# Patient Record
Sex: Female | Born: 1985 | Race: White | Hispanic: No | Smoking: Current every day smoker
Health system: Southern US, Community
[De-identification: ages and names within clinical notes are randomized; demographics above are authoritative.]

## PROBLEM LIST (undated history)

## (undated) DIAGNOSIS — F32A Depression, unspecified: Secondary | ICD-10-CM

## (undated) DIAGNOSIS — F329 Major depressive disorder, single episode, unspecified: Secondary | ICD-10-CM

## (undated) DIAGNOSIS — G43909 Migraine, unspecified, not intractable, without status migrainosus: Secondary | ICD-10-CM

## (undated) DIAGNOSIS — D649 Anemia, unspecified: Secondary | ICD-10-CM

## (undated) DIAGNOSIS — F419 Anxiety disorder, unspecified: Secondary | ICD-10-CM

## (undated) DIAGNOSIS — G930 Cerebral cysts: Secondary | ICD-10-CM

## (undated) DIAGNOSIS — R569 Unspecified convulsions: Secondary | ICD-10-CM

## (undated) DIAGNOSIS — J45909 Unspecified asthma, uncomplicated: Secondary | ICD-10-CM

## (undated) DIAGNOSIS — R87629 Unspecified abnormal cytological findings in specimens from vagina: Secondary | ICD-10-CM

## (undated) HISTORY — DX: Anxiety disorder, unspecified: F41.9

## (undated) HISTORY — DX: Depression, unspecified: F32.A

## (undated) HISTORY — PX: DENTAL SURGERY: SHX609

## (undated) HISTORY — DX: Migraine, unspecified, not intractable, without status migrainosus: G43.909

## (undated) HISTORY — DX: Major depressive disorder, single episode, unspecified: F32.9

## (undated) HISTORY — DX: Unspecified asthma, uncomplicated: J45.909

---
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
## 2007-09-20 HISTORY — PX: WISDOM TOOTH EXTRACTION: SHX21

## 2009-10-16 DIAGNOSIS — G930 Cerebral cysts: Secondary | ICD-10-CM

## 2009-10-16 HISTORY — DX: Cerebral cysts: G93.0

## 2010-06-14 ENCOUNTER — Emergency Department (HOSPITAL_COMMUNITY): Admission: EM | Admit: 2010-06-14 | Discharge: 2010-06-15 | Payer: Self-pay | Admitting: Emergency Medicine

## 2010-12-02 LAB — URINALYSIS, ROUTINE W REFLEX MICROSCOPIC
Glucose, UA: NEGATIVE mg/dL
Ketones, ur: NEGATIVE mg/dL
Leukocytes, UA: NEGATIVE
Protein, ur: NEGATIVE mg/dL

## 2010-12-02 LAB — ETHANOL: Alcohol, Ethyl (B): 101 mg/dL — ABNORMAL HIGH (ref 0–10)

## 2010-12-02 LAB — BASIC METABOLIC PANEL
CO2: 24 mEq/L (ref 19–32)
Chloride: 108 mEq/L (ref 96–112)
Creatinine, Ser: 0.51 mg/dL (ref 0.4–1.2)
GFR calc Af Amer: >60 mL/min (ref >60)
GFR calc non Af Amer: >60 mL/min (ref >60)
Sodium: 141 mEq/L (ref 135–145)

## 2010-12-02 LAB — CBC
HCT: 34.6 % — ABNORMAL LOW (ref 36.0–46.0)
Hemoglobin: 12 g/dL (ref 12.0–15.0)
RBC: 3.78 MIL/uL — ABNORMAL LOW (ref 3.87–5.11)
RDW: 12.9 % (ref 11.5–15.5)
WBC: 4.9 10*3/uL (ref 4.0–10.5)

## 2010-12-02 LAB — RAPID URINE DRUG SCREEN, HOSP PERFORMED
Amphetamines: NOT DETECTED
Benzodiazepines: POSITIVE — AB
Cocaine: NOT DETECTED

## 2010-12-02 LAB — DIFFERENTIAL
Basophils Absolute: 0 10*3/uL (ref 0.0–0.1)
Lymphocytes Relative: 38 % (ref 12–46)
Lymphs Abs: 1.9 10*3/uL (ref 0.7–4.0)
Monocytes Absolute: 0.3 10*3/uL (ref 0.1–1.0)
Monocytes Relative: 6 % (ref 3–12)
Neutro Abs: 2.6 10*3/uL (ref 1.7–7.7)

## 2010-12-02 LAB — POCT PREGNANCY, URINE: Preg Test, Ur: NEGATIVE

## 2014-07-28 ENCOUNTER — Ambulatory Visit (INDEPENDENT_AMBULATORY_CARE_PROVIDER_SITE_OTHER): Payer: Medicaid Other | Admitting: Neurology

## 2014-07-28 ENCOUNTER — Encounter: Payer: Self-pay | Admitting: Neurology

## 2014-07-28 VITALS — BP 115/78 | HR 81 | Ht 64.0 in | Wt 121.0 lb

## 2014-07-28 DIAGNOSIS — Z8669 Personal history of other diseases of the nervous system and sense organs: Secondary | ICD-10-CM

## 2014-07-28 DIAGNOSIS — G43109 Migraine with aura, not intractable, without status migrainosus: Secondary | ICD-10-CM

## 2014-07-28 DIAGNOSIS — G930 Cerebral cysts: Secondary | ICD-10-CM

## 2014-07-28 MED ORDER — TOPIRAMATE 50 MG PO TABS: 50.0000 mg | ORAL_TABLET | Freq: Two times a day (BID) | ORAL | Status: DC

## 2014-07-28 MED ORDER — ONDANSETRON HCL 4 MG PO TABS: 4.0000 mg | ORAL_TABLET | Freq: Two times a day (BID) | ORAL | Status: DC | PRN

## 2014-07-28 NOTE — Progress Notes (Signed)
Subjective:    Patient ID: Hayley Peterson is a 28 y.o. female.  HPI     Huston Foley, MD, PhD [facility] [address], [address] [address] [place], [place] [address]  Dear Dr. Loney Hering,   I saw your patient, Hayley Peterson, upon your kind request in my neurologic clinic today for initial consultation of her migraine headaches and what you refer to as seizure like activity. The patient is accompanied by her mother today. As you know, Hayley Peterson is a 28 year old right-handed woman with an underlying medical history of asthma, panic attacks, smoking, folliculitis, fibromyalgia, insomnia, who reports a 10 year history of migraine headaches off and on, worse during pregnancy. She has a 39 year old daughter and a 29-year-old son. She describes a one-sided throbbing headache. She has nearly daily migraines, she describes an occasional visual aura with blurry vision and sometimes visual field cut. She has not had her eyes checked in some time. She was recently started on Topamax 25 mg twice daily about a week ago and feels it has not helped so far. She says that because she did not have insurance she did not try anything else in the past other than over-the-counter medication including Advil, Aleve, Tylenol, BC powder, Goody's. None of these were very helpful. She also reports a history of seizures. She was treated in the past with Lamictal and also with gabapentin but has not been on medication for months as I understand. She says her last seizure was about 3 months ago and is not able to describe her seizures. Her mother adds that she had a grand mal seizure before and was taken to [facility] for this. Her last seizure was witnessed by her husband. She is currently separated. EMS was not called and she did not go to the hospital. She is in the process of scheduling oral surgery for extensive dental work.  According to your clinic note from 07/22/2014 she had workup for seizures  extensively at [facility] in 2011 and was not diagnosed with seizures. She did not have insurance at the time.   She had seen a neurologist in [place], Dr. Ninetta Lights, in 2011 for seizures and had an MRI brain and EEG. She reports having had an abnormal EEG. She was on Sz medications. But is currently not on medications specifically for seizures. Unfortunately I do not have any records from [facility] or Dr. Ninetta Lights available for review. For migraine prevention you recently started her on Topamax, 25 mg twice daily. She reports associated photophobia and nausea. She tried Phenergan in the past but it was too sedating. She says her MRI showed an arachnoid cyst. I do not have the MRI report available for review. She had it done at [facility].  Her Past Medical History Is Significant For: Past Medical History  Diagnosis Date  . Asthma   . Anxiety   . Depression   . Migraines     Her Past Surgical History Is Significant For: Past Surgical History  Procedure Laterality Date  . Wisdom tooth extraction  2009    Her Family History Is Significant For: Family History  Problem Relation Age of Onset  . Heart disease Maternal Grandfather   . Kidney disease Maternal Grandfather   . Clotting disorder Maternal Grandmother   . Stroke    . Stroke Father   . Heart disease Paternal Uncle   . Heart attack Paternal Grandfather   . Heart attack Father  Her Social History Is Significant For: History   Social History  . Marital Status: Legally Separated    Spouse Name: N/A    Number of Children: 2  . Years of Education: 11   Occupational History  . 2     unemployed   Social History Main Topics  . Smoking status: Current Every Day Smoker  . Smokeless tobacco: Never Used     Comment: 1/2 pack daily  . Alcohol Use: No  . Drug Use: No  . Sexual Activity: None   Other Topics Concern  . None   Social History Narrative   Patient consumes 8-10 cups of  caffeine daily    Her Allergies Are:  Allergies  Allergen Reactions  . Latex Rash  :   Her Current Medications Are:  Outpatient Encounter Prescriptions as of 07/28/2014  Medication Sig  . albuterol (PROVENTIL HFA;VENTOLIN HFA) 108 (90 BASE) MCG/ACT inhaler Inhale into the lungs every 6 (six) hours as needed for wheezing or shortness of breath. 2 puffs as needed  . FLUoxetine (PROZAC) 40 MG capsule Take 40 mg by mouth daily.  Marland Kitchen. sulfamethoxazole-trimethoprim (SEPTRA DS) 800-160 MG per tablet Take 1 tablet by mouth 2 (two) times daily.  Marland Kitchen. topiramate (TOPAMAX) 50 MG tablet Take 1 tablet (50 mg total) by mouth 2 (two) times daily.  . [DISCONTINUED] topiramate (TOPAMAX) 25 MG tablet Take 25 mg by mouth 2 (two) times daily.  . ondansetron (ZOFRAN) 4 MG tablet Take 1 tablet (4 mg total) by mouth 2 (two) times daily as needed for nausea or vomiting.  :  Review of Systems:  Out of a complete 14 point review of systems, all are reviewed and negative with the exception of these symptoms as listed below:    Review of Systems  Constitutional: Positive for fever and chills.       Weight loss  HENT:       Spinning sensation  Eyes:       Blurred vision  Respiratory: Positive for cough, shortness of breath and wheezing.        Snoring  Cardiovascular: Positive for chest pain.  Endocrine: Positive for cold intolerance.  Musculoskeletal:       Joint pain, cramps, aching muscles  Allergic/Immunologic:       Skin sensitivity  Neurological: Positive for tremors, seizures, weakness and headaches.       Memory loss, restless legs  Hematological: Bruises/bleeds easily.  Psychiatric/Behavioral: Positive for confusion.       Depression, anxiety, decreased energy,  Racing thoughts    Objective:  Neurologic Exam  Physical Exam Physical Examination:   Filed Vitals:   07/28/14 0957  BP: 115/78  Pulse: 81   General Examination: The patient is a very pleasant 28 y.o. female in no acute distress.  She appears well-developed and well-nourished and adequately groomed. She is quite slender.  HEENT: Normocephalic, atraumatic, pupils are equal, round and reactive to light and accommodation. Funduscopic exam is normal with sharp disc margins noted. Extraocular tracking is good without limitation to gaze excursion or nystagmus noted. Normal smooth pursuit is noted. Hearing is grossly intact. Tympanic membranes are clear bilaterally. Face is symmetric with normal facial animation and normal facial sensation. Speech is clear with no dysarthria noted. There is no hypophonia. There is no lip, neck/head, jaw or voice tremor. Neck is supple with full range of passive and active motion. There are no carotid bruits on auscultation. Oropharynx exam reveals: mild mouth dryness, poor dental hygiene and no  significant airway crowding. Tonsils are small. She gingival hypertrophy and appears to have periodontal disease as well. Mallampati is class I. Tongue protrudes centrally and palate elevates symmetrically.    Chest: Clear to auscultation without wheezing, rhonchi or crackles noted.  Heart: S1+S2+0, regular and normal without murmurs, rubs or gallops noted.   Abdomen: Soft, non-tender and non-distended with normal bowel sounds appreciated on auscultation.  Extremities: There is no pitting edema in the distal lower extremities bilaterally. Pedal pulses are intact.  Skin: Warm and dry without trophic changes noted. There are no varicose veins.  Musculoskeletal: exam reveals no obvious joint deformities, tenderness or joint swelling or erythema.   Neurologically:  Mental status: The patient is awake, alert and oriented in all 4 spheres. Her immediate and remote memory, attention, language skills and fund of knowledge are appropriate. There is no evidence of aphasia, agnosia, apraxia or anomia. Speech is clear with normal prosody and enunciation. Thought process is linear. Mood is normal and affect is normal.   Cranial nerves II - XII are as described above under HEENT exam. In addition: shoulder shrug is normal with equal shoulder height noted. Motor exam: Normal bulk, strength and tone is noted. There is no drift, tremor or rebound. Romberg is negative. Reflexes are 2+ throughout. Babinski: Toes are flexor bilaterally. Fine motor skills and coordination: intact with normal finger taps, normal hand movements, normal rapid alternating patting, normal foot taps and normal foot agility.  Cerebellar testing: No dysmetria or intention tremor on finger to nose testing. Heel to shin is unremarkable bilaterally. There is no truncal or gait ataxia.  Sensory exam: intact to light touch, pinprick, vibration, temperature sense in the upper and lower extremities.  Gait, station and balance: She stands easily. No veering to one side is noted. No leaning to one side is noted. Posture is age-appropriate and stance is narrow based. Gait shows normal stride length and normal pace. No problems turning are noted. She turns en bloc. Tandem walk is unremarkable. Intact toe and heel stance is noted.               Assessment and Plan:   In summary, Hayley Peterson is a very pleasant 28 y.o.-year old female with an underlying medical history of asthma, panic attacks, smoking, folliculitis, fibromyalgia, insomnia, who reports a 10 year history of migraine headaches off and on, with nearly daily headaches at this time. Her history is suggestive of migraine with aura. Her physical exam is normal. She is reassured in that regard. She reports a seizure history. She reports having had arachnoid cyst diagnosed before on brain MRI. I explained to her that an arachnoid cyst is typically congenital. We can certainly repeat her brain MRI for comparison. We need records from [facility] as well as from her prior neurologist. She is advised to sign release of healthcare information and we will try to get her  records. If you have any records from [facility] or from Dr. Ninetta Lights, I would greatly appreciate if you can fax them to me as well. At this juncture, I suggested she increase Topamax to 50 g twice daily. For nausea I suggested Zofran as needed. We will call her with her MRI results.  I had a long chat with the patient and her mother about my findings and the diagnosis of migraines, the prognosis and treatment options. We talked about medical treatments and non-pharmacological approaches. We talked about maintaining a healthy lifestyle in general. I  encouraged the patient to eat healthy, exercise daily and keep well hydrated, to keep a scheduled bedtime and wake time routine, to not skip any meals and eat healthy snacks in between meals and to have protein with every meal.   I advised the patient about common headache triggers: sleep deprivation, dehydration, overheating, stress, hypoglycemia or skipping meals and blood sugar fluctuations, excessive pain medications or excessive alcohol use or caffeine withdrawal. Some people have food triggers such as aged cheese, orange juice or chocolate, especially dark chocolate, or MSG (monosodium glutamate). She is to try to avoid these headache triggers as much possible. It may be helpful to keep a headache diary to figure out what makes Her headaches worse or brings them on and what alleviates them. Some people report headache onset after exercise but studies have shown that regular exercise may actually prevent headaches from coming. If She has exercise-induced headaches, She is advised to drink plenty of fluid before and after exercising and that to not overdo it and to not overheat.  They are advised to call 911 if she were to have a seizure.  Again, I advised him that I need my records regarding her presumed seizure diagnosis. I did not suggest any other new medications today. I answered all her questions today and the patient and her mother were in agreement. Of  note, the patient currently lives with her mother and her 2 children. I will see her in 3 months, sooner if the need arises.  Thank you very much for allowing me to participate in the care of this nice patient. If I can be of any further assistance to you please do not hesitate to call me at [phone].  Sincerely,   Huston FoleySaima Madsen Riddle, MD, PhD

## 2014-07-28 NOTE — Patient Instructions (Addendum)
We will try to get records from Dr. Ninetta Lightsesfaye and [facility] regarding your seizure history.   Please remember, common headache triggers are: sleep deprivation, dehydration, overheating, stress, hypoglycemia or skipping meals and blood sugar fluctuations, excessive pain medications or excessive alcohol use or caffeine withdrawal. Some people have food triggers such as aged cheese, orange juice or chocolate, especially dark chocolate, or MSG (monosodium glutamate). Try to avoid these headache triggers as much possible. It may be helpful to keep a headache diary to figure out what makes your headaches worse or brings them on and what alleviates them. Some people report headache onset after exercise but studies have shown that regular exercise may actually prevent headaches from coming. If you have exercise-induced headaches, please make sure that you drink plenty of fluid before and after exercising and that you do not over do it and do not overheat.  We will do another brain MRI for comparison.   We will increase your Topamax to 50 mg twice daily and use Zofran as needed for nausea.

## 2014-08-19 ENCOUNTER — Ambulatory Visit
Admission: RE | Admit: 2014-08-19 | Discharge: 2014-08-19 | Disposition: A | Discharge status: Home / Self Care | Payer: Medicaid Other | Source: Ambulatory Visit | Attending: Neurology | Admitting: Neurology

## 2014-08-19 ENCOUNTER — Encounter (INDEPENDENT_AMBULATORY_CARE_PROVIDER_SITE_OTHER): Payer: Medicaid Other | Admitting: Diagnostic Neuroimaging

## 2014-08-19 DIAGNOSIS — G930 Cerebral cysts: Secondary | ICD-10-CM

## 2014-08-19 DIAGNOSIS — Z8669 Personal history of other diseases of the nervous system and sense organs: Secondary | ICD-10-CM

## 2014-08-19 DIAGNOSIS — G43109 Migraine with aura, not intractable, without status migrainosus: Secondary | ICD-10-CM

## 2014-08-21 NOTE — Progress Notes (Signed)
Quick Note:  Please call patient: Recent brain MRI without contrast showed stable findings and report suggests unchanged findings in fact from a previous MRI from 10/05/2010. This is reassuring. No further action is required. Hayley FoleySaima Marston Mccadden, MD, PhD [facility]  ______

## 2014-10-28 ENCOUNTER — Ambulatory Visit: Payer: Medicaid Other | Admitting: Neurology

## 2014-12-03 ENCOUNTER — Ambulatory Visit (INDEPENDENT_AMBULATORY_CARE_PROVIDER_SITE_OTHER): Payer: Medicaid Other | Admitting: Neurology

## 2014-12-03 ENCOUNTER — Encounter: Payer: Self-pay | Admitting: Neurology

## 2014-12-03 VITALS — BP 98/50 | HR 74 | Resp 12 | Ht 64.0 in | Wt 115.0 lb

## 2014-12-03 DIAGNOSIS — F419 Anxiety disorder, unspecified: Secondary | ICD-10-CM

## 2014-12-03 DIAGNOSIS — Z720 Tobacco use: Secondary | ICD-10-CM

## 2014-12-03 DIAGNOSIS — F17200 Nicotine dependence, unspecified, uncomplicated: Secondary | ICD-10-CM

## 2014-12-03 DIAGNOSIS — G930 Cerebral cysts: Secondary | ICD-10-CM

## 2014-12-03 DIAGNOSIS — G43109 Migraine with aura, not intractable, without status migrainosus: Secondary | ICD-10-CM | POA: Diagnosis not present

## 2014-12-03 MED ORDER — TOPIRAMATE 50 MG PO TABS: ORAL_TABLET | ORAL | Status: DC

## 2014-12-03 NOTE — Patient Instructions (Addendum)
Please remember, common headache triggers are: sleep deprivation, dehydration, overheating, stress, hypoglycemia or skipping meals and blood sugar fluctuations, excessive pain medications or excessive alcohol use or caffeine withdrawal. Some people have food triggers such as aged cheese, orange juice or chocolate, especially dark chocolate, or MSG (monosodium glutamate). Try to avoid these headache triggers as much possible. It may be helpful to keep a headache diary to figure out what makes your headaches worse or brings them on and what alleviates them. Some people report headache onset after exercise but studies have shown that regular exercise may actually prevent headaches from coming. If you have exercise-induced headaches, please make sure that you drink plenty of fluid before and after exercising and that you do not over do it and do not overheat.  We will increase your topamax to 50 mg in AM and 100 mg at night, this may help you sleep better too.  Topamax can lower the efficacy of hormonal birth control. Please use a secondary birth control, such as barrier protection.   Follow up in 4 to 5 months.   Drink more water and reduce your Cola intake. Please stop smoking.

## 2014-12-03 NOTE — Progress Notes (Signed)
Subjective:    Patient ID: Hayley Peterson is a 29 y.o. female.  HPI     Interim history:   Hayley Peterson is a 29 year old right-handed woman with an underlying medical history of asthma, panic attacks, smoking, folliculitis, history of brain arachnoid cyst, fibromyalgia, history of seizures in the past, and insomnia, who presents for follow-up consultation of her migraine headaches. The patient is accompanied by her husband, Jeneen Rinks, today. I first met her on 07/28/2014 at the request of her primary care physician, at which time she reported a 10 year history of migraine headaches off and on, worse during pregnancy. I suggested she continue with Topamax for headache prevention and prescribed Zofran as needed for nausea. I suggested we request previous neurology records from her prior neurologist. We talked about headache prevention and seizure precautions last time. I suggested a brain MRI without contrast. She had this on 08/19/2014: Unremarkable MRI brain (without) demonstrating stable small right posterior fossa arachnoid cyst. Remainder of brain parenchyma normal. No change from MRI on 10/05/10. In addition, personally reviewed the images through the PACS system. I did not see any acute changes. We called her with her test results.   Today, 12/03/2014: She is providing most of her own history but her husband provides some additional information. She reports that she still is getting migraines about 2 or 3 times per week. She's not sure of the increase in Topamax helped at all. She has to use Zofran only very occasionally has nausea is not a big player. She has no side effects from the increase in Topamax. She has been on Prozac through her GYN which has helped her depression but she still has anxiety issues. She has not yet seen her primary care physician for this. She admits that she does not drink enough water. She drinks more than 3 or 4 cans of cola per day. She still smokes, half a pack per day. She  endorsed stress.  Previously:  She has a 36 year old daughter and a 84-year-old son. She describes a one-sided throbbing headache. She has nearly daily migraines, she describes an occasional visual aura with blurry vision and sometimes visual field cut. She has not had her eyes checked in some time. She was recently started on Topamax 25 mg twice daily about a week ago and feels it has not helped so far. She says that because she did not have insurance she did not try anything else in the past other than over-the-counter medication including Advil, Aleve, Tylenol, BC powder, Goody's. None of these were very helpful. She also reports a history of seizures. She was treated in the past with Lamictal and also with gabapentin but has not been on medication for months as I understand. She says her last seizure was about 3 months ago and is not able to describe her seizures. Her mother adds that she had a grand mal seizure before and was taken to [facility] for this. Her last seizure was witnessed by her husband. She is currently separated. EMS was not called and she did not go to the hospital. She is in the process of scheduling oral surgery for extensive dental work.   According to your clinic note from 07/22/2014 she had workup for seizures extensively at [facility] in 2011 and was not diagnosed with seizures. She did not have insurance at the time.    She had seen a neurologist in [place], Dr. Brandon Melnick, in 2011 for seizures and had an  MRI brain and EEG. She reports having had an abnormal EEG. She was on Sz medications. But is currently not on medications specifically for seizures. Unfortunately I do not have any records from [facility] or Dr. Brandon Melnick available for review. For migraine prevention you recently started her on Topamax, 25 mg twice daily. She reports associated photophobia and nausea. She tried Phenergan in the past but it was too sedating. She says her MRI showed  an arachnoid cyst. I do not have the MRI report available for review. She had it done at [facility].  Her Past Medical History Is Significant For: Past Medical History  Diagnosis Date  . Asthma   . Anxiety   . Depression   . Migraines     Her Past Surgical History Is Significant For: Past Surgical History  Procedure Laterality Date  . Wisdom tooth extraction  2009    Her Family History Is Significant For: Family History  Problem Relation Age of Onset  . Heart disease Maternal Grandfather   . Kidney disease Maternal Grandfather   . Clotting disorder Maternal Grandmother   . Stroke    . Stroke Father   . Heart disease Paternal Uncle   . Heart attack Paternal Grandfather   . Heart attack Father     Her Social History Is Significant For: History   Social History  . Marital Status: Legally Separated    Spouse Name: N/A  . Number of Children: 2  . Years of Education: 11   Occupational History  . 2     unemployed   Social History Main Topics  . Smoking status: Current Every Day Smoker  . Smokeless tobacco: Never Used     Comment: 1/2 pack daily  . Alcohol Use: No  . Drug Use: No  . Sexual Activity: Not on file   Other Topics Concern  . None   Social History Narrative   Patient consumes 8-10 cups of caffeine daily    Her Allergies Are:  Allergies  Allergen Reactions  . Latex Rash  :   Her Current Medications Are:  Outpatient Encounter Prescriptions as of 12/03/2014  Medication Sig  . albuterol (PROVENTIL HFA;VENTOLIN HFA) 108 (90 BASE) MCG/ACT inhaler Inhale into the lungs every 6 (six) hours as needed for wheezing or shortness of breath. 2 puffs as needed  . FLUoxetine (PROZAC) 40 MG capsule Take 40 mg by mouth daily.  . ondansetron (ZOFRAN) 4 MG tablet Take 1 tablet (4 mg total) by mouth 2 (two) times daily as needed for nausea or vomiting.  . topiramate (TOPAMAX) 50 MG tablet Take 1 tablet (50 mg total) by mouth 2 (two) times daily.  .  [DISCONTINUED] sulfamethoxazole-trimethoprim (SEPTRA DS) 800-160 MG per tablet Take 1 tablet by mouth 2 (two) times daily.  :  Review of Systems:  Out of a complete 14 point review of systems, all are reviewed and negative with the exception of these symptoms as listed below:   Review of Systems  Neurological: Positive for headaches.       Feels that Topamax 71m BID is not helping her headaches.  Reports that she is still having panic attacks.     Objective:  Neurologic Exam  Physical Exam Physical Examination:   Filed Vitals:   12/03/14 1114  BP: 98/50  Pulse: 74  Resp: 12   General Examination: The patient is a very pleasant 29y.o. female in no acute distress. She appears well-developed and well-nourished and adequately groomed. She is quite  slender.  HEENT: Normocephalic, atraumatic, pupils are equal, round and reactive to light and accommodation. Funduscopic exam is normal with sharp disc margins noted. Extraocular tracking is good without limitation to gaze excursion or nystagmus noted. Normal smooth pursuit is noted. Hearing is grossly intact. Face is symmetric with normal facial animation and normal facial sensation. Speech is clear with no dysarthria noted. There is no hypophonia. There is no lip, neck/head, jaw or voice tremor. Neck is supple with full range of passive and active motion. There are no carotid bruits on auscultation. Oropharynx exam reveals: mild mouth dryness, poor dental hygiene and no significant airway crowding. Tonsils are small. She gingival hypertrophy and appears to have periodontal disease as well. She is missing both front teeth. Mallampati is class I. Tongue protrudes centrally and palate elevates symmetrically.    Chest: Clear to auscultation without wheezing, rhonchi or crackles noted.  Heart: S1+S2+0, regular and normal without murmurs, rubs or gallops noted.   Abdomen: Soft, non-tender and non-distended with normal bowel sounds appreciated on  auscultation.  Extremities: There is no pitting edema in the distal lower extremities bilaterally. Pedal pulses are intact.  Skin: Warm and dry without trophic changes noted. There are no varicose veins.  Musculoskeletal: exam reveals no obvious joint deformities, tenderness or joint swelling or erythema.   Neurologically:  Mental status: The patient is awake, alert and oriented in all 4 spheres. Her immediate and remote memory, attention, language skills and fund of knowledge are appropriate. There is no evidence of aphasia, agnosia, apraxia or anomia. Speech is clear with normal prosody and enunciation. Thought process is linear. Mood is normal and affect is normal.  Cranial nerves II - XII are as described above under HEENT exam. In addition: shoulder shrug is normal with equal shoulder height noted. Motor exam: Normal bulk, strength and tone is noted. There is no drift, tremor or rebound. Romberg is negative. Reflexes are 2+ throughout. Babinski: Toes are flexor bilaterally. Fine motor skills and coordination: intact with normal finger taps, normal hand movements, normal rapid alternating patting, normal foot taps and normal foot agility.  Cerebellar testing: No dysmetria or intention tremor on finger to nose testing. Heel to shin is unremarkable bilaterally. There is no truncal or gait ataxia.  Sensory exam: intact to light touch, pinprick, vibration, temperature sense in the upper and lower extremities.  Gait, station and balance: She stands easily. No veering to one side is noted. No leaning to one side is noted. Posture is age-appropriate and stance is narrow based. Gait shows normal stride length and normal pace. No problems turning are noted. She turns en bloc. Tandem walk is unremarkable.              Assessment and Plan:   In summary, TIFFINE HENIGAN is a very pleasant 29 year old female with an underlying medical history of asthma, panic attacks, smoking, depression, anxiety,   folliculitis, fibromyalgia,  and insomnia, who presents for follow-up consultation of her migraines. She still has fairly frequent migraines. We talked about her brain MRI results today. I reassured her that findings were stable. I personally reviewed the images as well. Unfortunately, I did not receive any records from her prior neurologist. Her physical exam is stable and she is reassured. She is encouraged to drink more water. She is advised to reduce her caffeine intake which can also cause rebound headaches. She is advised to stop smoking. We talked about headache triggers again today. For anxiety she is advised to talk  to her primary care physician and make an appointment with his office. She was in agreement. For her migraines I would like for her to increase Topamax to 50 mg in the morning and increase the nighttime dose to 100 mg each night. She has been able to tolerate it thus far. I adjusted her prescription and she can continue to take Zofran as needed for nausea.  We talked about medical treatments and non-pharmacological approaches. We talked about maintaining a healthy lifestyle in general. I encouraged the patient to eat healthy, exercise daily and keep well hydrated, to keep a scheduled bedtime and wake time routine, to not skip any meals and eat healthy snacks in between meals and to have protein with every meal.   I advised the patient about common headache triggers: sleep deprivation, dehydration, overheating, stress, hypoglycemia or skipping meals and blood sugar fluctuations, excessive pain medications or excessive alcohol use or caffeine withdrawal. Some people have food triggers such as aged cheese, orange juice or chocolate, especially dark chocolate, or MSG (monosodium glutamate). She is to try to avoid these headache triggers as much possible. It may be helpful to keep a headache diary to figure out what makes Her headaches worse or brings them on and what alleviates them. Some people  report headache onset after exercise but studies have shown that regular exercise may actually prevent headaches from coming. If She has exercise-induced headaches, She is advised to drink plenty of fluid before and after exercising and that to not overdo it and to not overheat. I would like to see her back in 4-5 months, sooner if the need arises. I answered all her questions today and she and her husband were in agreement.  I spent 20 minutes in total face-to-face time with the patient, more than 50% of which was spent in counseling and coordination of care, reviewing test results, reviewing medication and discussing or reviewing the diagnosis of migraines and anxiety, the prognosis and treatment options.

## 2015-04-13 ENCOUNTER — Telehealth: Payer: Self-pay

## 2015-04-13 NOTE — Telephone Encounter (Signed)
I left patient a message. She has appt in August but we have some openings this week. I stated that if she is interested to call back and I will get her in this week.

## 2015-05-05 ENCOUNTER — Ambulatory Visit: Payer: Medicaid Other | Admitting: Neurology

## 2015-06-30 ENCOUNTER — Ambulatory Visit (INDEPENDENT_AMBULATORY_CARE_PROVIDER_SITE_OTHER): Payer: Medicaid Other | Admitting: Neurology

## 2015-06-30 ENCOUNTER — Encounter: Payer: Self-pay | Admitting: Neurology

## 2015-06-30 VITALS — BP 102/64 | HR 80 | Resp 16 | Ht 64.0 in | Wt 119.0 lb

## 2015-06-30 DIAGNOSIS — F17200 Nicotine dependence, unspecified, uncomplicated: Secondary | ICD-10-CM

## 2015-06-30 DIAGNOSIS — G930 Cerebral cysts: Secondary | ICD-10-CM | POA: Diagnosis not present

## 2015-06-30 DIAGNOSIS — G43109 Migraine with aura, not intractable, without status migrainosus: Secondary | ICD-10-CM | POA: Diagnosis not present

## 2015-06-30 DIAGNOSIS — Z8669 Personal history of other diseases of the nervous system and sense organs: Secondary | ICD-10-CM | POA: Diagnosis not present

## 2015-06-30 DIAGNOSIS — Z720 Tobacco use: Secondary | ICD-10-CM

## 2015-06-30 MED ORDER — ONDANSETRON HCL 4 MG PO TABS: 4.0000 mg | ORAL_TABLET | Freq: Two times a day (BID) | ORAL | Status: DC | PRN

## 2015-06-30 MED ORDER — BUTALBITAL-APAP-CAFFEINE 50-325-40 MG PO TABS: 1.0000 | ORAL_TABLET | Freq: Four times a day (QID) | ORAL | Status: DC | PRN

## 2015-06-30 MED ORDER — TOPIRAMATE 50 MG PO TABS: ORAL_TABLET | ORAL | Status: DC

## 2015-06-30 MED ORDER — NORTRIPTYLINE HCL 10 MG PO CAPS: ORAL_CAPSULE | ORAL | Status: DC

## 2015-06-30 NOTE — Progress Notes (Signed)
Subjective:    Patient ID: Hayley Peterson is a 29 y.o. female.  HPI     Interim history:   Hayley Peterson is a 29 year old right-handed woman with an underlying medical history of asthma, panic attacks, smoking, folliculitis, history of brain arachnoid cyst, fibromyalgia, history of seizures in the past, and insomnia, who presents for follow-up consultation of Hayley migraine headaches. The patient is accompanied by Hayley Peterson today. I last saw Hayley on 12/03/2014, at which time she reported a migraine frequency of 2 or 3 per week. She was not sure if Topamax at the higher dose was helpful. She was using Zofran as needed. Occasionally for nausea. She had no side effects from the Topamax. She had some residual anxiety issues. She was not drinking enough water. She was drinking 3 or 4 cans of cola per day. She was smoking half a pack per day and reported stress as well.   Today, 06/30/2015: She reports more frequent migraines, about 2 times a week. She has had more nausea. In the past, she had tried phenergan, but it made Hayley too sleepy. She tried Elavil in the past, and does not think it helped. She is afraid to stop topamax d/t to Hayley sz history she says. She does not drink enough water. She drinks some sodas and a large Starbucks coffee typically. She has remained stable with Hayley weight.   Previously:    I first met Hayley on 07/28/2014 at the request of Hayley primary care physician, at which time she reported a 10 year history of migraine headaches off and on, worse during pregnancy. I suggested she continue with Topamax for headache prevention and prescribed Zofran as needed for nausea. I suggested we request previous neurology records from Hayley prior neurologist. We talked about headache prevention and seizure precautions last time. I suggested a brain MRI without contrast. She had this on 08/19/2014: Unremarkable MRI brain (without) demonstrating stable small right posterior fossa arachnoid cyst. Remainder of  brain parenchyma normal. No change from MRI on 10/05/10. In addition, personally reviewed the images through the PACS system. I did not see any acute changes. We called Hayley with Hayley test results.    She has a 50 year old daughter and a 47-year-old son. She describes a one-sided throbbing headache. She has nearly daily migraines, she describes an occasional visual aura with blurry vision and sometimes visual field cut. She has not had Hayley eyes checked in some time. She was recently started on Topamax 25 mg twice daily about a week ago and feels it has not helped so far. She says that because she did not have insurance she did not try anything else in the past other than over-the-counter medication including Advil, Aleve, Tylenol, BC powder, Goody's. None of these were very helpful. She also reports a history of seizures. She was treated in the past with Lamictal and also with gabapentin but has not been on medication for months as I understand. She says Hayley last seizure was about 3 months ago and is not able to describe Hayley seizures. Hayley Peterson adds that she had a grand mal seizure before and was taken to [facility] for this. Hayley last seizure was witnessed by Hayley husband. She is currently separated. EMS was not called and she did not go to the hospital. She is in the process of scheduling oral surgery for extensive dental work.   According to your clinic note from 07/22/2014 she had workup for seizures extensively at [facility]  [facility] in 2011 and was not diagnosed with seizures. She did not have insurance at the time.    She had seen a neurologist in [place], Dr. Brandon Melnick, in 2011 for seizures and had an MRI brain and EEG. She reports having had an abnormal EEG. She was on Sz medications. But is currently not on medications specifically for seizures. Unfortunately I do not have any records from [facility] or Dr. Brandon Melnick available for review. For migraine prevention you recently  started Hayley on Topamax, 25 mg twice daily. She reports associated photophobia and nausea. She tried Phenergan in the past but it was too sedating. She says Hayley MRI showed an arachnoid cyst. I do not have the MRI report available for review. She had it done at [facility].   Hayley Past Medical History Is Significant For: Past Medical History  Diagnosis Date  . Asthma   . Anxiety   . Depression   . Migraines     Hayley Past Surgical History Is Significant For: Past Surgical History  Procedure Laterality Date  . Wisdom tooth extraction  2009    Hayley Family History Is Significant For: Family History  Problem Relation Age of Onset  . Heart disease Maternal Grandfather   . Kidney disease Maternal Grandfather   . Clotting disorder Maternal Grandmother   . Stroke    . Stroke Father   . Heart disease Paternal Uncle   . Heart attack Paternal Grandfather   . Heart attack Father     Hayley Social History Is Significant For: Social History   Social History  . Marital Status: Legally Separated    Spouse Name: N/A  . Number of Children: 2  . Years of Education: 11   Occupational History  . 2     unemployed   Social History Main Topics  . Smoking status: Current Every Day Smoker  . Smokeless tobacco: Never Used     Comment: 1/2 pack daily  . Alcohol Use: No  . Drug Use: No  . Sexual Activity: Not Asked   Other Topics Concern  . None   Social History Narrative   Patient consumes 8-10 cups of caffeine daily    Hayley Allergies Are:  Allergies  Allergen Reactions  . Latex Rash  :  Hayley Current Medications Are:  Outpatient Encounter Prescriptions as of 06/30/2015  Medication Sig  . albuterol (PROVENTIL HFA;VENTOLIN HFA) 108 (90 BASE) MCG/ACT inhaler Inhale into the lungs every 6 (six) hours as needed for wheezing or shortness of breath. 2 puffs as needed  . FLUoxetine (PROZAC) 40 MG capsule Take 40 mg by mouth daily.  . ondansetron (ZOFRAN) 4 MG tablet Take 1 tablet (4 mg  total) by mouth 2 (two) times daily as needed for nausea or vomiting.  . topiramate (TOPAMAX) 50 MG tablet 1 pill in AM and 2 pills at bedtime.   No facility-administered encounter medications on file as of 06/30/2015.  :  Review of Systems:  Out of a complete 14 point review of systems, all are reviewed and negative with the exception of these symptoms as listed below:   Review of Systems  Neurological:       Patent states that Hayley migraines are worse. Recently missed 3 days of work. This past weekend had a migraine with N/V, states she cannot keep any food down, reports that she is having some balance issues with it too. Has missed work this week. Reports taking Topamax $RemoveBefore'50mg'olViHdgVPRlXY$  once in morning and 2  at night.     Objective:  Neurologic Exam  Physical Exam Physical Examination:   Filed Vitals:   06/30/15 0817  BP: 102/64  Pulse: 80  Resp: 16   General Examination: The patient is a very pleasant 29 y.o. female in no acute distress. She appears well-developed and well-nourished and adequately groomed. She is quite slender but stable.  HEENT: Normocephalic, atraumatic, pupils are equal, round and reactive to light and accommodation. Funduscopic exam is normal with sharp disc margins noted. Extraocular tracking is good without limitation to gaze excursion or nystagmus noted. Normal smooth pursuit is noted. Hearing is grossly intact. Face is symmetric with normal facial animation and normal facial sensation. Speech is clear with no dysarthria noted. There is no hypophonia. There is no lip, neck/head, jaw or voice tremor. Neck is supple with full range of passive and active motion. There are no carotid bruits on auscultation. Oropharynx exam reveals: mild mouth dryness, marginal dental hygiene and no significant airway crowding.   Chest: Clear to auscultation without wheezing, rhonchi or crackles noted.  Heart: S1+S2+0, regular and normal without murmurs, rubs or gallops noted.   Abdomen:  Soft, non-tender and non-distended with normal bowel sounds appreciated on auscultation.  Extremities: There is no pitting edema in the distal lower extremities bilaterally. Pedal pulses are intact.  Skin: Warm and dry without trophic changes noted. There are no varicose veins.  Musculoskeletal: exam reveals no obvious joint deformities, tenderness or joint swelling or erythema.   Neurologically:  Mental status: The patient is awake, alert and oriented in all 4 spheres. Hayley immediate and remote memory, attention, language skills and fund of knowledge are appropriate. There is no evidence of aphasia, agnosia, apraxia or anomia. Speech is clear with normal prosody and enunciation. Thought process is linear. Mood is normal and affect is normal.  Cranial nerves II - XII are as described above under HEENT exam. In addition: shoulder shrug is normal with equal shoulder height noted. Motor exam: Normal bulk, strength and tone is noted. There is no drift, tremor or rebound. Romberg is negative. Reflexes are 2+ throughout. Babinski: Toes are flexor bilaterally. Fine motor skills and coordination: intact with normal finger taps, normal hand movements, normal rapid alternating patting, normal foot taps and normal foot agility.  Cerebellar testing: No dysmetria or intention tremor on finger to nose testing. Heel to shin is unremarkable bilaterally. There is no truncal or gait ataxia.  Sensory exam: intact to light touch, vibration, and temperature sense in the upper and lower extremities.  Gait, station and balance: She stands easily. No veering to one side is noted. No leaning to one side is noted. Posture is age-appropriate and stance is narrow based. Gait shows normal stride length and normal pace. No problems turning are noted. She turns en bloc. Tandem walk is unremarkable.              Assessment and Plan:   In summary, BRONWYN BELASCO is a very pleasant 29 year old female with an underlying medical  history of asthma, panic attacks, smoking, depression, anxiety,  folliculitis, fibromyalgia, possible seizure Dx in the past and insomnia, who presents for follow-up consultation of Hayley migraines. She still has fairly frequent migraines. We talked about Hayley brain MRI previously. Hayley physical exam is stable. Previously, she has tried other preventative migraine medications and also reports that she tried sumatriptan which did not help. Phenergan was too sedating for Hayley. Amitriptyline may not have been helpful. Today, I suggested that she  continue with Zofran as needed and Topamax at the current dose. Any increase in dose may cause Hayley to lose too much weight. Hayley BMI is right at 20 at this time. She is advised to try nortriptyline starting with 10 mg pills, 1 pill at night for a week and then we will increase it to 20 mg each night thereafter. We can increase it further if needed. I talked Hayley about potential side effects and gave Hayley written instructions and a new prescription. I renewed Hayley prescription for Zofran and Topamax. She is advised to quit smoking, reduce Hayley caffeine intake and perhaps limit herself to just 1 caffeine beverage per day and increase Hayley water intake. We talked about headache triggers again today. Furthermore, for abortive treatment I suggested as needed use of Fioricet. I did talked Hayley about potential side effects including sedation and the possible addictive properties of Fioricet. I provided Hayley with a new prescription for this as well. She is advised to use it sparingly.  We talked about medical treatments and non-pharmacological approaches. We talked about maintaining a healthy lifestyle in general. I encouraged the patient to eat healthy, exercise daily and keep well hydrated, to keep a scheduled bedtime and wake time routine, to not skip any meals and eat healthy snacks in between meals and to have protein with every meal.   I advised the patient about common headache triggers:  sleep deprivation, dehydration, overheating, stress, hypoglycemia or skipping meals and blood sugar fluctuations, excessive pain medications or excessive alcohol use or caffeine withdrawal. Some people have food triggers such as aged cheese, orange juice or chocolate, especially dark chocolate, or MSG (monosodium glutamate). She is to try to avoid these headache triggers as much possible. It may be helpful to keep a headache diary to figure out what makes Hayley headaches worse or brings them on and what alleviates them. Some people report headache onset after exercise but studies have shown that regular exercise may actually prevent headaches from coming. If She has exercise-induced headaches, She is advised to drink plenty of fluid before and after exercising and that to not overdo it and to not overheat. I suggested that she follow-up in 3 months with one of our nurse practitioners, I will see Hayley back after that. I answered all Hayley questions today and she and Hayley Peterson were in agreement.   I spent 25 minutes in total face-to-face time with the patient, more than 50% of which was spent in counseling and coordination of care, reviewing test results, reviewing medication and discussing or reviewing the diagnosis of migraines, the prognosis and treatment options.

## 2015-06-30 NOTE — Patient Instructions (Addendum)
We will continue with topamax and Zofran. We will try as needed fioricet for migraine attacks as needed, but use sparingly, as it can be addictive and it may be sedating.  As a second preventative medication, let's try: nortriptyline, 10 mg: Take 1 pill daily at bedtime for one week, then 2 pills daily at bedtime thereafter. Common side effects reported are: mouth dryness, drowsiness, confusion, dizziness. We can increase the nortriptyline if needed down the road. Follow up with Aundra Millet, NP, in 3 months, I will see you after that.

## 2015-10-12 ENCOUNTER — Ambulatory Visit (INDEPENDENT_AMBULATORY_CARE_PROVIDER_SITE_OTHER): Payer: Medicaid Other | Admitting: Nurse Practitioner

## 2015-10-12 ENCOUNTER — Encounter: Payer: Self-pay | Admitting: Nurse Practitioner

## 2015-10-12 VITALS — BP 101/64 | HR 89 | Ht 64.0 in | Wt 111.0 lb

## 2015-10-12 DIAGNOSIS — Z8669 Personal history of other diseases of the nervous system and sense organs: Secondary | ICD-10-CM | POA: Diagnosis not present

## 2015-10-12 DIAGNOSIS — G43909 Migraine, unspecified, not intractable, without status migrainosus: Secondary | ICD-10-CM | POA: Diagnosis not present

## 2015-10-12 DIAGNOSIS — G930 Cerebral cysts: Secondary | ICD-10-CM | POA: Diagnosis not present

## 2015-10-12 DIAGNOSIS — G43109 Migraine with aura, not intractable, without status migrainosus: Secondary | ICD-10-CM | POA: Diagnosis not present

## 2015-10-12 MED ORDER — BUTALBITAL-APAP-CAFFEINE 50-325-40 MG PO TABS: 1.0000 | ORAL_TABLET | Freq: Four times a day (QID) | ORAL | Status: DC | PRN

## 2015-10-12 MED ORDER — NORTRIPTYLINE HCL 10 MG PO CAPS: ORAL_CAPSULE | ORAL | Status: DC

## 2015-10-12 NOTE — Patient Instructions (Signed)
Continue Pamelor at current dose will refill Continue Topamax at current dose does not need refills Continue Fioricet will refill BE aware of migraine triggers and tries to avoid those Follow-up in 6 months

## 2015-10-12 NOTE — Progress Notes (Addendum)
[facility]  PATIENT: Hayley Peterson DOB: 1986-06-07   REASON FOR VISIT: Follow-up for worsening migraines, history of seizure disorder, smoker HISTORY FROM: Patient, mother    HISTORY OF PRESENT ILLNESS: HISTORYSA Hayley Peterson is a 30 year old right-handed woman with an underlying medical history of asthma, panic attacks, smoking, folliculitis, history of brain arachnoid cyst, fibromyalgia, history of seizures in the past, and insomnia, who presents for follow-up consultation of her migraine headaches. The patient is accompanied by her mother today. I last saw her on 12/03/2014, at which time she reported a migraine frequency of 2 or 3 per week. She was not sure if Topamax at the higher dose was helpful. She was using Zofran as needed. Occasionally for nausea. She had no side effects from the Topamax. She had some residual anxiety issues. She was not drinking enough water. She was drinking 3 or 4 cans of cola per day. She was smoking half a pack per day and reported stress as well.    06/30/2015:SA She reports more frequent migraines, about 2 times a week. She has had more nausea. In the past, she had tried phenergan, but it made her too sleepy. She tried Elavil in the past, and does not think it helped. She is afraid to stop topamax d/t to her sz history she says. She does not drink enough water. She drinks some sodas and a large Starbucks coffee typically. She has remained stable with her weight.   UPDATE 1/23/17Mrs Peterson, 30 year old female returns for follow-up with her mother. She recently separated from her husband several weeks ago and has had some increased stress due to this. She claims her husband threw out  most of her meds except for her Pamelor and Topamax. He is an alcoholic. She has moved close to her mother with her children. She has social work involved as well as a Event organiser friend. She is complaining of memory loss however she does not want to cut down  the dose of Topamax at present and some of this could be due to stress. 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 She returns for reevaluation  PreviouslySA:I first met her on 07/28/2014 at the request of her primary care physician, at which time she reported a 10 year history of migraine headaches off and on, worse during pregnancy. I suggested she continue with Topamax for headache prevention and prescribed Zofran as needed for nausea. I suggested we request previous neurology records from her prior neurologist. We talked about headache prevention and seizure precautions last time. I suggested a brain MRI without contrast. She had this on 08/19/2014: Unremarkable MRI brain (without) demonstrating stable small right posterior fossa arachnoid cyst. Remainder of brain parenchyma normal. No change from MRI on 10/05/10. In addition, personally reviewed the images through the PACS system. I did not see any acute changes. We called her with her test results.    REVIEW OF SYSTEMS: Full 14 system review of systems performed and notable only for those listed, all others are neg:  Constitutional: neg  Cardiovascular: neg Ear/Nose/Throat: neg  Skin: neg Eyes: neg Respiratory: neg Gastroitestinal: neg  Hematology/Lymphatic: neg  Endocrine: neg Musculoskeletal: Joint pain, neck stiffness Allergy/Immunology: neg Neurological: Migraine headaches, memory loss Psychiatric: Depression and anxiety Sleep : neg   ALLERGIES: Allergies  Allergen Reactions  . Latex Rash    HOME MEDICATIONS: Outpatient Prescriptions Prior to Visit  Medication Sig Dispense Refill  . albuterol (PROVENTIL HFA;VENTOLIN HFA) 108 (90 BASE) MCG/ACT inhaler Inhale into the lungs  every 6 (six) hours as needed for wheezing or shortness of breath. 2 puffs as needed    . nortriptyline (PAMELOR) 10 MG capsule Take 1 pill daily at bedtime for one week, then 2 pills daily at bedtime thereafter. 60 capsule 3  . topiramate (TOPAMAX) 50 MG  tablet 1 pill in AM and 2 pills at bedtime. 90 tablet 5  . butalbital-acetaminophen-caffeine (FIORICET, ESGIC) 50-325-40 MG tablet Take 1 tablet by mouth every 6 (six) hours as needed for headache. (Patient not taking: Reported on 10/12/2015) 10 tablet 1  . FLUoxetine (PROZAC) 40 MG capsule Take 40 mg by mouth daily. Reported on 10/12/2015    . ondansetron (ZOFRAN) 4 MG tablet Take 1 tablet (4 mg total) by mouth 2 (two) times daily as needed for nausea or vomiting. (Patient not taking: Reported on 10/12/2015) 20 tablet 5   No facility-administered medications prior to visit.    PAST MEDICAL HISTORY: Past Medical History  Diagnosis Date  . Asthma   . Anxiety   . Depression   . Migraines     PAST SURGICAL HISTORY: Past Surgical History  Procedure Laterality Date  . Wisdom tooth extraction  2009    FAMILY HISTORY: Family History  Problem Relation Age of Onset  . Heart disease Maternal Grandfather   . Kidney disease Maternal Grandfather   . Clotting disorder Maternal Grandmother   . Stroke    . Stroke Father   . Heart disease Paternal Uncle   . Heart attack Paternal Grandfather   . Heart attack Father     SOCIAL HISTORY: Social History   Social History  . Marital Status: Legally Separated    Spouse Name: N/A  . Number of Children: 2  . Years of Education: 11   Occupational History  . 2     unemployed   Social History Main Topics  . Smoking status: Current Every Day Smoker -- 0.45 packs/day    Types: Cigarettes  . Smokeless tobacco: Never Used     Comment: 1/2 pack daily  . Alcohol Use: No  . Drug Use: No  . Sexual Activity: Not on file   Other Topics Concern  . Not on file   Social History Narrative   Patient consumes 8-10 cups of caffeine daily     PHYSICAL EXAM  Filed Vitals:   10/12/15 0857  BP: 101/64  Pulse: 89  Height: '5\' 4"'$  (1.626 m)  Weight: 111 lb (50.349 kg)   Body mass index is 19.04 kg/(m^2).  Generalized: Well developed, in no acute  distress  Head: normocephalic and atraumatic,. Oropharynx benign  Neck: Supple, no carotid bruits  Cardiac: Regular rate rhythm, no murmur  Musculoskeletal: No deformity   Neurological examination   Mentation: Alert oriented to time, place, history taking. Attention span and concentration appropriate. Recent and remote memory intact.  Follows all commands speech and language fluent.   Cranial nerve II-XII: .Pupils were equal round reactive to light extraocular movements were full, visual field were full on confrontational test. Facial sensation and strength were normal. hearing was intact to finger rubbing bilaterally. Uvula tongue midline. head turning and shoulder shrug were normal and symmetric.Tongue protrusion into cheek strength was normal. Motor: normal bulk and tone, full strength in the BUE, BLE, fine finger movements normal, no pronator drift. No focal weakness Sensory: normal and symmetric to light touch, pinprick, and  Vibration,  Coordination: finger-nose-finger, heel-to-shin bilaterally, no dysmetria Reflexes: Brachioradialis 2/2, biceps 2/2, triceps 2/2, patellar 2/2, Achilles 2/2, plantar responses  were flexor bilaterally. Gait and Station: Rising up from seated position without assistance, normal stance,  moderate stride, good arm swing, smooth turning, able to perform tiptoe, and heel walking without difficulty. Tandem gait is steady  DIAGNOSTIC DATA (LABS, IMAGING, TESTING) -   ASSESSMENT AND PLAN  30 y.o. year old female  has a past medical history of Asthma; Anxiety; Depression; and Migraines. here to follow-up. She has recently been legally separated from her husband and she has more stress associated with this. She does not wish to change her medicine regimen at this time.  Continue Pamelor at current dose will refill Continue Topamax at current dose does not need refills Continue Fioricet RX to patiet BE aware of migraine triggers and try to avoid those, she was  given a list of food triggers and, environmental triggers. Follow-up in 6 monthsVst time 25 min Dennie Bible, [facility], [facility], APRN  [facility] [address], [place], [place] [address] [phone]  I reviewed the above note and documentation by the Nurse Practitioner and agree with the history, physical exam, assessment and plan as outlined above. I was immediately available for face-to-face consultation. Star Age, MD, PhD [facility])

## 2015-12-29 ENCOUNTER — Ambulatory Visit: Payer: Medicaid Other | Admitting: Neurology

## 2016-02-05 ENCOUNTER — Other Ambulatory Visit: Payer: Self-pay | Admitting: Neurology

## 2016-03-22 ENCOUNTER — Other Ambulatory Visit: Payer: Self-pay | Admitting: Nurse Practitioner

## 2016-03-23 ENCOUNTER — Other Ambulatory Visit: Payer: Self-pay | Admitting: *Deleted

## 2016-03-23 DIAGNOSIS — G930 Cerebral cysts: Secondary | ICD-10-CM

## 2016-03-23 DIAGNOSIS — G43109 Migraine with aura, not intractable, without status migrainosus: Secondary | ICD-10-CM

## 2016-03-23 DIAGNOSIS — Z8669 Personal history of other diseases of the nervous system and sense organs: Secondary | ICD-10-CM

## 2016-03-23 MED ORDER — BUTALBITAL-APAP-CAFFEINE 50-325-40 MG PO TABS: 1.0000 | ORAL_TABLET | Freq: Four times a day (QID) | ORAL | Status: DC | PRN

## 2016-03-23 NOTE — Telephone Encounter (Signed)
See other note. Done.  

## 2016-03-23 NOTE — Telephone Encounter (Signed)
Fiorcet refill request.

## 2016-03-23 NOTE — Telephone Encounter (Signed)
Received fax confirmation [facility] [phone].

## 2016-04-11 ENCOUNTER — Ambulatory Visit: Payer: Medicaid Other | Admitting: Nurse Practitioner

## 2016-04-12 ENCOUNTER — Encounter: Payer: Self-pay | Admitting: Nurse Practitioner

## 2018-02-08 ENCOUNTER — Encounter (HOSPITAL_COMMUNITY): Payer: Self-pay

## 2018-02-09 ENCOUNTER — Other Ambulatory Visit (HOSPITAL_COMMUNITY): Payer: Self-pay | Admitting: Nurse Practitioner

## 2018-02-09 DIAGNOSIS — Z363 Encounter for antenatal screening for malformations: Secondary | ICD-10-CM

## 2018-02-09 DIAGNOSIS — Z3A19 19 weeks gestation of pregnancy: Secondary | ICD-10-CM

## 2018-02-09 DIAGNOSIS — O30032 Twin pregnancy, monochorionic/diamniotic, second trimester: Secondary | ICD-10-CM

## 2018-02-09 DIAGNOSIS — O0932 Supervision of pregnancy with insufficient antenatal care, second trimester: Secondary | ICD-10-CM

## 2018-02-09 DIAGNOSIS — G40909 Epilepsy, unspecified, not intractable, without status epilepticus: Secondary | ICD-10-CM

## 2018-02-09 DIAGNOSIS — O99352 Diseases of the nervous system complicating pregnancy, second trimester: Secondary | ICD-10-CM

## 2018-02-13 ENCOUNTER — Encounter (HOSPITAL_COMMUNITY): Payer: Self-pay

## 2018-02-19 ENCOUNTER — Encounter (HOSPITAL_COMMUNITY): Payer: Self-pay | Admitting: *Deleted

## 2018-02-20 ENCOUNTER — Ambulatory Visit (HOSPITAL_COMMUNITY)
Admission: RE | Admit: 2018-02-20 | Discharge: 2018-02-20 | Disposition: A | Discharge status: Home / Self Care | Payer: 59 | Source: Ambulatory Visit | Attending: Nurse Practitioner | Admitting: Nurse Practitioner

## 2018-02-20 ENCOUNTER — Encounter (HOSPITAL_COMMUNITY): Payer: Self-pay

## 2018-02-20 DIAGNOSIS — G40909 Epilepsy, unspecified, not intractable, without status epilepticus: Secondary | ICD-10-CM | POA: Insufficient documentation

## 2018-02-20 DIAGNOSIS — O30032 Twin pregnancy, monochorionic/diamniotic, second trimester: Secondary | ICD-10-CM

## 2018-02-20 DIAGNOSIS — Z363 Encounter for antenatal screening for malformations: Secondary | ICD-10-CM | POA: Diagnosis present

## 2018-02-20 DIAGNOSIS — Z3A19 19 weeks gestation of pregnancy: Secondary | ICD-10-CM | POA: Diagnosis not present

## 2018-02-20 DIAGNOSIS — O0932 Supervision of pregnancy with insufficient antenatal care, second trimester: Secondary | ICD-10-CM | POA: Insufficient documentation

## 2018-02-20 DIAGNOSIS — O99352 Diseases of the nervous system complicating pregnancy, second trimester: Secondary | ICD-10-CM | POA: Diagnosis not present

## 2018-02-20 HISTORY — DX: Unspecified convulsions: R56.9

## 2018-02-20 HISTORY — DX: Unspecified abnormal cytological findings in specimens from vagina: R87.629

## 2018-02-20 HISTORY — DX: Cerebral cysts: G93.0

## 2018-02-20 HISTORY — DX: Anemia, unspecified: D64.9

## 2018-02-20 NOTE — Progress Notes (Signed)
MFM consult, Staff Note:  By way of consultation, I briefly explained the biology of twinning, both monozygotic and dizygotic.  I described the sequence of development of the chorionic and amniotic membranes, and the clinical significance of chorionicity.  I spoke to Ms. Hayley Peterson  about the risks and management of twin pregnancy.  I explained the risk of preterm labor and delivery, with the mean gestational age at delivery being around 35 weeks.  I also outlined the increased risk of abnormal fetal growth, especially IUGR, and especially in the third trimester.  I reviewed the increased risks of preeclampsia, gestational diabetes, and maternal anemia.  Because she has a monochorionic-diamniotic twin pregnancy, I also discussed the risk of twin-twin transfusion syndrome.  I outlined the standard management plan for twin pregnancy with Ms. Nesler. I also recommended repeat ultrasound exams every four weeks to plot fetal growth, position, and amniotic fluid volume with limited ultrasounds every two weeks between.  Because of the subjective variance in fluid and growth (18% concordance is technically concordant), I arranged for weekly follow up for the next two weeks to be cautious and facilitate earlier identification of TTTS if such diagnosis tries to evolve from this point.  That being said, it is reasonable to revert to the general timing of surveillance provided TTTS is not seen over the next two weeks.  Beginning at around 30-32 weeks, she should have weekly BPP until delivery.  Because of the increased risk of GDM, an early glucola screen should be considered.  If normal, the test should be repeated at around 28 weeks.  At each of her office visits through the third trimester, the usual careful attention should be paid to blood pressure, proteinuria, weight gain, etc., as a screen for pre-eclampsia.  Additionally, Ms. Hayley Ruiziffany M Beeks had an ultrasound appointment today.  Please see [facility]  report for details, noting the following is a summary:  Comments There is an active monochorionic-diamniotic twin pregnancy on today's routine anatomic examination.   Twin A Appropriate interval fetal growth (38th%). Normal amniotic fluid volume (MVP 6.3). No structural defects on fetal anatomy.   Nomally filled bladder and stomach   Twin B Appropriate interval fetal growth (<25th%). Normal amniotic fluid volume (MVP 4.3). No structural defects on fetal anatomy Nomally filled bladder and stomach  Concordant fluid and growth (18% discordance) no clear evidence of TTTS   Recommendations 1. fetal echocardiogram 2. given subjective variance (albeit technically concordant by MVP's) of fluid volume and 18% discordance (technically concordant), I have set patient up for weekly visits x 2 weeks with routine follow up thereafter (q2 week TTTS screens and q4week growth evaluations) 3. begin antenatal testing at 32 weeks 4. delivery at 36-37 weeks if twin pair remains uncomplicated  Gianne Shugars, Louann SjogrenJeffrey Morgan, MD, MS, FACOG Assistant Professor Section of Maternal-Fetal Medicine [facility]

## 2018-02-22 ENCOUNTER — Other Ambulatory Visit (HOSPITAL_COMMUNITY): Payer: Self-pay | Admitting: *Deleted

## 2018-02-22 DIAGNOSIS — O30039 Twin pregnancy, monochorionic/diamniotic, unspecified trimester: Secondary | ICD-10-CM

## 2018-02-27 ENCOUNTER — Encounter (HOSPITAL_COMMUNITY): Payer: Self-pay

## 2018-02-27 ENCOUNTER — Other Ambulatory Visit (HOSPITAL_COMMUNITY): Payer: Self-pay | Admitting: Obstetrics and Gynecology

## 2018-02-27 ENCOUNTER — Ambulatory Visit (HOSPITAL_COMMUNITY)
Admission: RE | Admit: 2018-02-27 | Discharge: 2018-02-27 | Disposition: A | Discharge status: Home / Self Care | Payer: 59 | Source: Ambulatory Visit | Attending: Nurse Practitioner | Admitting: Nurse Practitioner

## 2018-02-27 DIAGNOSIS — O99352 Diseases of the nervous system complicating pregnancy, second trimester: Secondary | ICD-10-CM | POA: Insufficient documentation

## 2018-02-27 DIAGNOSIS — O0932 Supervision of pregnancy with insufficient antenatal care, second trimester: Secondary | ICD-10-CM

## 2018-02-27 DIAGNOSIS — O402XX1 Polyhydramnios, second trimester, fetus 1: Secondary | ICD-10-CM

## 2018-02-27 DIAGNOSIS — O30039 Twin pregnancy, monochorionic/diamniotic, unspecified trimester: Secondary | ICD-10-CM

## 2018-02-27 DIAGNOSIS — Z3A20 20 weeks gestation of pregnancy: Secondary | ICD-10-CM

## 2018-02-27 DIAGNOSIS — O99332 Smoking (tobacco) complicating pregnancy, second trimester: Secondary | ICD-10-CM | POA: Insufficient documentation

## 2018-02-27 DIAGNOSIS — G40909 Epilepsy, unspecified, not intractable, without status epilepticus: Secondary | ICD-10-CM | POA: Diagnosis not present

## 2018-02-27 DIAGNOSIS — O30032 Twin pregnancy, monochorionic/diamniotic, second trimester: Secondary | ICD-10-CM | POA: Diagnosis not present

## 2018-02-27 DIAGNOSIS — O402XX0 Polyhydramnios, second trimester, not applicable or unspecified: Secondary | ICD-10-CM | POA: Diagnosis not present

## 2018-02-27 DIAGNOSIS — O3662X2 Maternal care for excessive fetal growth, second trimester, fetus 2: Secondary | ICD-10-CM

## 2018-02-27 DIAGNOSIS — Z362 Encounter for other antenatal screening follow-up: Secondary | ICD-10-CM

## 2018-03-06 ENCOUNTER — Other Ambulatory Visit (HOSPITAL_COMMUNITY): Payer: Self-pay | Admitting: Obstetrics and Gynecology

## 2018-03-06 ENCOUNTER — Ambulatory Visit (HOSPITAL_COMMUNITY)
Admission: RE | Admit: 2018-03-06 | Discharge: 2018-03-06 | Disposition: A | Discharge status: Home / Self Care | Payer: 59 | Source: Ambulatory Visit | Attending: Nurse Practitioner | Admitting: Nurse Practitioner

## 2018-03-06 ENCOUNTER — Encounter (HOSPITAL_COMMUNITY): Payer: Self-pay

## 2018-03-06 DIAGNOSIS — O402XX1 Polyhydramnios, second trimester, fetus 1: Secondary | ICD-10-CM

## 2018-03-06 DIAGNOSIS — Z3A21 21 weeks gestation of pregnancy: Secondary | ICD-10-CM | POA: Diagnosis not present

## 2018-03-06 DIAGNOSIS — Z362 Encounter for other antenatal screening follow-up: Secondary | ICD-10-CM

## 2018-03-06 DIAGNOSIS — O99332 Smoking (tobacco) complicating pregnancy, second trimester: Secondary | ICD-10-CM

## 2018-03-06 DIAGNOSIS — O30032 Twin pregnancy, monochorionic/diamniotic, second trimester: Secondary | ICD-10-CM | POA: Diagnosis not present

## 2018-03-06 DIAGNOSIS — O30039 Twin pregnancy, monochorionic/diamniotic, unspecified trimester: Secondary | ICD-10-CM

## 2018-03-06 DIAGNOSIS — O99352 Diseases of the nervous system complicating pregnancy, second trimester: Secondary | ICD-10-CM | POA: Diagnosis not present

## 2018-03-06 DIAGNOSIS — O0932 Supervision of pregnancy with insufficient antenatal care, second trimester: Secondary | ICD-10-CM | POA: Insufficient documentation

## 2018-03-06 DIAGNOSIS — G40909 Epilepsy, unspecified, not intractable, without status epilepticus: Secondary | ICD-10-CM | POA: Diagnosis not present

## 2018-03-06 NOTE — Addendum Note (Signed)
Encounter addended by: Emeline DarlingKiser, Undray Allman E, RT on: 03/06/2018 4:01 PM  Actions taken: Imaging Exam ended

## 2018-03-07 ENCOUNTER — Other Ambulatory Visit (HOSPITAL_COMMUNITY): Payer: Self-pay | Admitting: *Deleted

## 2018-03-07 DIAGNOSIS — O30032 Twin pregnancy, monochorionic/diamniotic, second trimester: Secondary | ICD-10-CM

## 2018-03-15 ENCOUNTER — Encounter (HOSPITAL_COMMUNITY): Payer: Self-pay

## 2018-03-15 ENCOUNTER — Ambulatory Visit (HOSPITAL_COMMUNITY)
Admission: RE | Admit: 2018-03-15 | Discharge: 2018-03-15 | Disposition: A | Discharge status: Home / Self Care | Payer: 59 | Source: Ambulatory Visit | Attending: Nurse Practitioner | Admitting: Nurse Practitioner

## 2018-03-15 ENCOUNTER — Other Ambulatory Visit (HOSPITAL_COMMUNITY): Payer: Self-pay | Admitting: Obstetrics and Gynecology

## 2018-03-15 DIAGNOSIS — G40909 Epilepsy, unspecified, not intractable, without status epilepticus: Secondary | ICD-10-CM

## 2018-03-15 DIAGNOSIS — O30032 Twin pregnancy, monochorionic/diamniotic, second trimester: Secondary | ICD-10-CM | POA: Insufficient documentation

## 2018-03-15 DIAGNOSIS — O99332 Smoking (tobacco) complicating pregnancy, second trimester: Secondary | ICD-10-CM | POA: Diagnosis not present

## 2018-03-15 DIAGNOSIS — O0932 Supervision of pregnancy with insufficient antenatal care, second trimester: Secondary | ICD-10-CM | POA: Insufficient documentation

## 2018-03-15 DIAGNOSIS — Z3A22 22 weeks gestation of pregnancy: Secondary | ICD-10-CM

## 2018-03-15 DIAGNOSIS — O99352 Diseases of the nervous system complicating pregnancy, second trimester: Secondary | ICD-10-CM | POA: Diagnosis not present

## 2018-03-15 DIAGNOSIS — O402XX0 Polyhydramnios, second trimester, not applicable or unspecified: Secondary | ICD-10-CM | POA: Diagnosis not present

## 2018-03-19 ENCOUNTER — Encounter (HOSPITAL_COMMUNITY): Payer: Self-pay

## 2018-03-20 ENCOUNTER — Ambulatory Visit (HOSPITAL_COMMUNITY)
Admission: RE | Admit: 2018-03-20 | Discharge: 2018-03-20 | Disposition: A | Discharge status: Home / Self Care | Payer: 59 | Source: Ambulatory Visit | Attending: Nurse Practitioner | Admitting: Nurse Practitioner

## 2018-03-20 ENCOUNTER — Other Ambulatory Visit (HOSPITAL_COMMUNITY): Payer: Self-pay | Admitting: Obstetrics and Gynecology

## 2018-03-20 ENCOUNTER — Encounter (HOSPITAL_COMMUNITY): Payer: Self-pay

## 2018-03-20 DIAGNOSIS — O99352 Diseases of the nervous system complicating pregnancy, second trimester: Secondary | ICD-10-CM | POA: Insufficient documentation

## 2018-03-20 DIAGNOSIS — O0932 Supervision of pregnancy with insufficient antenatal care, second trimester: Secondary | ICD-10-CM

## 2018-03-20 DIAGNOSIS — Z3A23 23 weeks gestation of pregnancy: Secondary | ICD-10-CM | POA: Diagnosis not present

## 2018-03-20 DIAGNOSIS — O30039 Twin pregnancy, monochorionic/diamniotic, unspecified trimester: Secondary | ICD-10-CM

## 2018-03-20 DIAGNOSIS — O365922 Maternal care for other known or suspected poor fetal growth, second trimester, fetus 2: Secondary | ICD-10-CM

## 2018-03-20 DIAGNOSIS — O30032 Twin pregnancy, monochorionic/diamniotic, second trimester: Secondary | ICD-10-CM | POA: Insufficient documentation

## 2018-03-20 DIAGNOSIS — O99332 Smoking (tobacco) complicating pregnancy, second trimester: Secondary | ICD-10-CM

## 2018-03-20 DIAGNOSIS — G40909 Epilepsy, unspecified, not intractable, without status epilepticus: Secondary | ICD-10-CM

## 2018-03-21 ENCOUNTER — Other Ambulatory Visit (HOSPITAL_COMMUNITY): Payer: Self-pay | Admitting: *Deleted

## 2018-03-21 DIAGNOSIS — O30032 Twin pregnancy, monochorionic/diamniotic, second trimester: Secondary | ICD-10-CM

## 2018-03-21 DIAGNOSIS — O365920 Maternal care for other known or suspected poor fetal growth, second trimester, not applicable or unspecified: Secondary | ICD-10-CM

## 2018-03-27 ENCOUNTER — Ambulatory Visit (HOSPITAL_COMMUNITY)
Admission: RE | Admit: 2018-03-27 | Discharge: 2018-03-27 | Disposition: A | Discharge status: Home / Self Care | Payer: 59 | Source: Ambulatory Visit | Attending: Nurse Practitioner | Admitting: Nurse Practitioner

## 2018-03-27 ENCOUNTER — Encounter (HOSPITAL_COMMUNITY): Payer: Self-pay

## 2018-03-27 DIAGNOSIS — O365922 Maternal care for other known or suspected poor fetal growth, second trimester, fetus 2: Secondary | ICD-10-CM | POA: Diagnosis not present

## 2018-03-27 DIAGNOSIS — O99332 Smoking (tobacco) complicating pregnancy, second trimester: Secondary | ICD-10-CM | POA: Insufficient documentation

## 2018-03-27 DIAGNOSIS — G40909 Epilepsy, unspecified, not intractable, without status epilepticus: Secondary | ICD-10-CM | POA: Diagnosis not present

## 2018-03-27 DIAGNOSIS — Z3A24 24 weeks gestation of pregnancy: Secondary | ICD-10-CM | POA: Diagnosis not present

## 2018-03-27 DIAGNOSIS — O365920 Maternal care for other known or suspected poor fetal growth, second trimester, not applicable or unspecified: Secondary | ICD-10-CM

## 2018-03-27 DIAGNOSIS — F17200 Nicotine dependence, unspecified, uncomplicated: Secondary | ICD-10-CM | POA: Diagnosis not present

## 2018-03-27 DIAGNOSIS — O99352 Diseases of the nervous system complicating pregnancy, second trimester: Secondary | ICD-10-CM | POA: Insufficient documentation

## 2018-03-27 DIAGNOSIS — O30032 Twin pregnancy, monochorionic/diamniotic, second trimester: Secondary | ICD-10-CM | POA: Diagnosis not present

## 2018-03-27 DIAGNOSIS — O0932 Supervision of pregnancy with insufficient antenatal care, second trimester: Secondary | ICD-10-CM | POA: Diagnosis not present

## 2018-03-27 MED ORDER — BETAMETHASONE SOD PHOS & ACET 6 (3-3) MG/ML IJ SUSP
12.0000 mg | Freq: Once | INTRAMUSCULAR | Status: AC
  Administered 2018-03-27: 12 mg via INTRAMUSCULAR
  Filled 2018-03-27: qty 2

## 2018-03-28 ENCOUNTER — Ambulatory Visit (HOSPITAL_COMMUNITY)
Admission: RE | Admit: 2018-03-28 | Discharge: 2018-03-28 | Disposition: A | Discharge status: Home / Self Care | Payer: 59 | Source: Ambulatory Visit | Attending: Nurse Practitioner | Admitting: Nurse Practitioner

## 2018-03-28 DIAGNOSIS — Z3A00 Weeks of gestation of pregnancy not specified: Secondary | ICD-10-CM | POA: Insufficient documentation

## 2018-03-28 DIAGNOSIS — O30032 Twin pregnancy, monochorionic/diamniotic, second trimester: Secondary | ICD-10-CM | POA: Diagnosis not present

## 2018-03-28 DIAGNOSIS — O365922 Maternal care for other known or suspected poor fetal growth, second trimester, fetus 2: Secondary | ICD-10-CM | POA: Diagnosis present

## 2018-03-28 MED ORDER — BETAMETHASONE SOD PHOS & ACET 6 (3-3) MG/ML IJ SUSP
12.0000 mg | Freq: Once | INTRAMUSCULAR | Status: AC
  Administered 2018-03-28: 12 mg via INTRAMUSCULAR
  Filled 2018-03-28: qty 2

## 2018-03-29 ENCOUNTER — Encounter (HOSPITAL_COMMUNITY): Payer: Self-pay

## 2018-03-30 ENCOUNTER — Encounter (HOSPITAL_COMMUNITY): Payer: Self-pay

## 2018-04-03 ENCOUNTER — Ambulatory Visit (HOSPITAL_COMMUNITY)
Admission: RE | Admit: 2018-04-03 | Discharge: 2018-04-03 | Disposition: A | Discharge status: Home / Self Care | Payer: 59 | Source: Ambulatory Visit | Attending: Nurse Practitioner | Admitting: Nurse Practitioner

## 2018-04-10 ENCOUNTER — Ambulatory Visit (HOSPITAL_COMMUNITY)
Admission: RE | Admit: 2018-04-10 | Discharge: 2018-04-10 | Disposition: A | Discharge status: Home / Self Care | Payer: 59 | Source: Ambulatory Visit | Attending: Nurse Practitioner | Admitting: Nurse Practitioner

## 2018-04-12 ENCOUNTER — Encounter (HOSPITAL_COMMUNITY): Payer: Self-pay

## 2018-04-17 ENCOUNTER — Ambulatory Visit (HOSPITAL_COMMUNITY)
Admission: RE | Admit: 2018-04-17 | Discharge: 2018-04-17 | Disposition: A | Discharge status: Home / Self Care | Payer: 59 | Source: Ambulatory Visit | Attending: Nurse Practitioner | Admitting: Nurse Practitioner

## 2018-04-19 MED ORDER — LACTATED RINGERS IV SOLN
500.00 | INTRAVENOUS | Status: DC
Start: ? — End: 2018-04-19

## 2018-04-19 MED ORDER — DOXYLAMINE SUCCINATE (SLEEP) 25 MG PO TABS
12.50 | ORAL_TABLET | ORAL | Status: DC
Start: 2018-04-19 — End: 2018-04-19

## 2018-04-19 MED ORDER — SODIUM CHLORIDE 0.9 % IJ SOLN
10.00 | INTRAMUSCULAR | Status: DC
Start: 2018-04-19 — End: 2018-04-19

## 2018-04-19 MED ORDER — ACETAMINOPHEN 325 MG PO TABS
650.00 | ORAL_TABLET | ORAL | Status: DC
Start: ? — End: 2018-04-19

## 2018-04-19 MED ORDER — SODIUM CHLORIDE 0.9 % IJ SOLN
10.00 | INTRAMUSCULAR | Status: DC
Start: ? — End: 2018-04-19

## 2018-04-19 MED ORDER — PYRIDOXINE HCL 25 MG PO TABS
25.00 | ORAL_TABLET | ORAL | Status: DC
Start: 2018-04-19 — End: 2018-04-19

## 2018-04-19 MED ORDER — LACTATED RINGERS IV SOLN
INTRAVENOUS | Status: DC
Start: ? — End: 2018-04-19

## 2018-04-19 NOTE — BH Specialist Note (Deleted)
Integrated Behavioral Health Initial Visit  MRN: [id number] Name: Hayley Ruiziffany M Perrault  Number of Integrated Behavioral Health Clinician visits:: 1/6 Session Start time: ***  Session End time: *** Total time: {IBH Total Time:21014050}  Type of Service: Integrated Behavioral Health- Individual/Family Interpretor:No. Interpretor Name and Language: n/a   Warm Hand Off Completed.       SUBJECTIVE: Hayley Peterson is a 32 y.o. female accompanied by {CHL AMB ACCOMPANIED QI:[id number]}[phone]} Patient was referred by Nettie ElmMichael Ervin, MD for initial OB introduction to integrated behavioral health services. Patient reports the following symptoms/concerns: *** Duration of problem: ***; Severity of problem: {Mild/Moderate/Severe:20260}  OBJECTIVE: Mood: {BHH MOOD:22306} and Affect: {BHH AFFECT:22307} Risk of harm to self or others: {CHL AMB BH Suicide Current Mental Status:21022748}  LIFE CONTEXT: Family and Social: *** School/Work: *** Self-Care: *** Life Changes: ***  GOALS ADDRESSED: Patient will: 1. Reduce symptoms of: {IBH Symptoms:21014056} 2. Increase knowledge and/or ability of: {IBH Patient Tools:21014057}  3. Demonstrate ability to: {IBH Goals:21014053}  INTERVENTIONS: Interventions utilized: {IBH Interventions:21014054}  Standardized Assessments completed: {IBH Screening Tools:21014051}  ASSESSMENT: Patient currently experiencing ***.   Patient may benefit from ***.  PLAN: 1. Follow up with behavioral health clinician on : *** 2. Behavioral recommendations: *** 3. Referral(s): {IBH Referrals:21014055} 4. "From scale of 1-10, how likely are you to follow plan?": ***  Jamie C McMannes, LCSW  No flowsheet data found.  .[phone]

## 2018-04-20 ENCOUNTER — Encounter: Payer: Medicaid Other | Admitting: Obstetrics and Gynecology

## 2018-05-01 ENCOUNTER — Other Ambulatory Visit (HOSPITAL_COMMUNITY): Payer: 59

## 2018-05-15 ENCOUNTER — Ambulatory Visit (HOSPITAL_COMMUNITY): Payer: 59

## 2018-05-29 ENCOUNTER — Other Ambulatory Visit (HOSPITAL_COMMUNITY): Payer: 59

## 2018-10-08 ENCOUNTER — Encounter (HOSPITAL_COMMUNITY): Payer: Self-pay

## 2020-05-19 IMAGING — US US MFM UA ADDL GEST
1 series · 15 of 28 positions shown · non-contrast
Comparison: none

[Series 1: us mfm ua addl gest · 34 acquisitions, 15 frames shown]
[im 1/34]
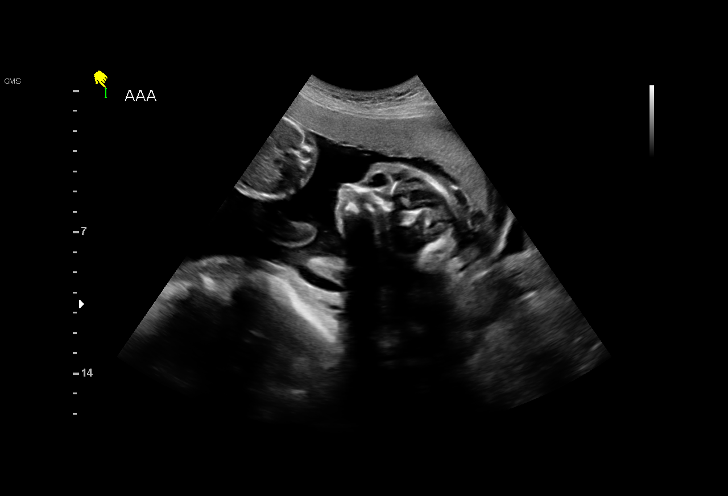
[im 3/34]
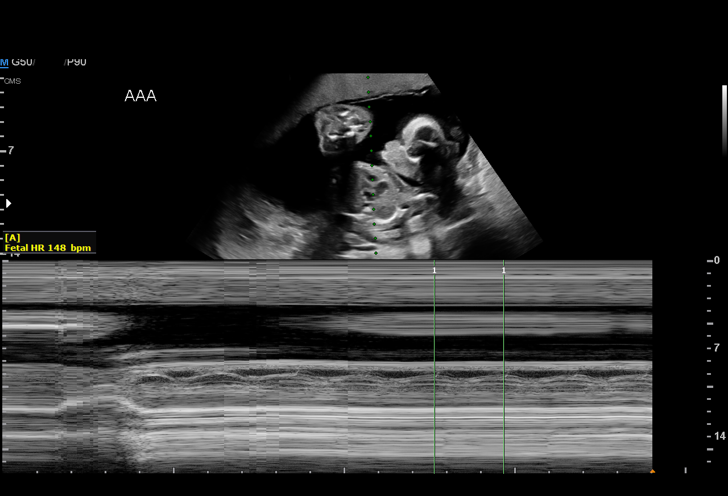
[im 5/34]
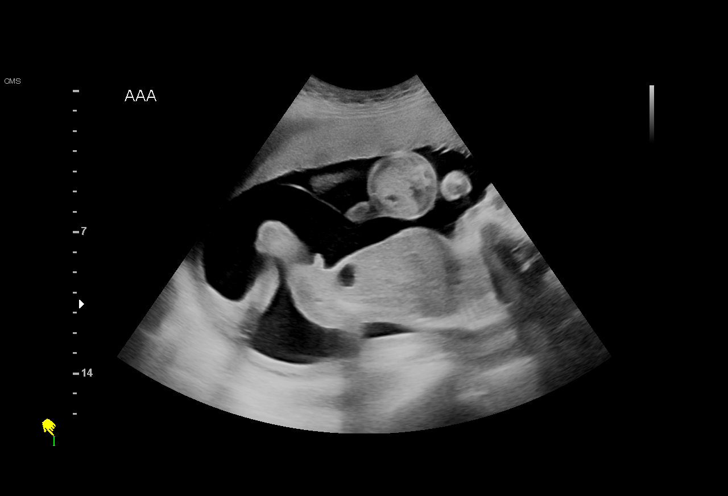
[im 8/34]
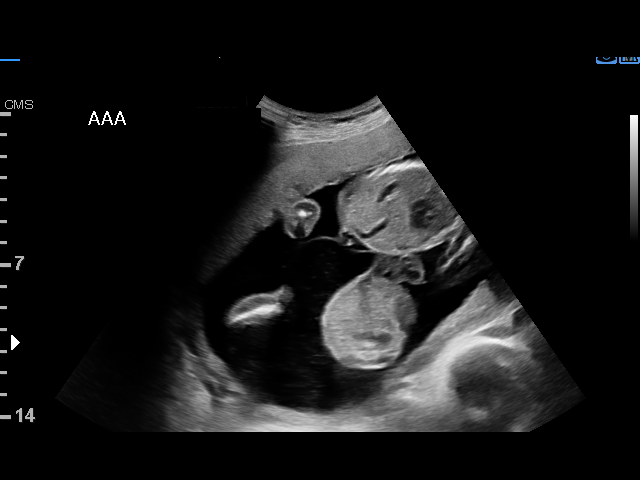
[im 10/34]
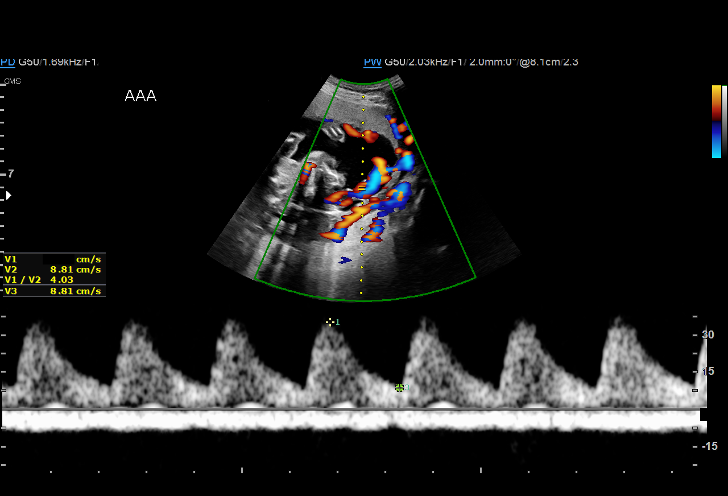
[im 13/34]
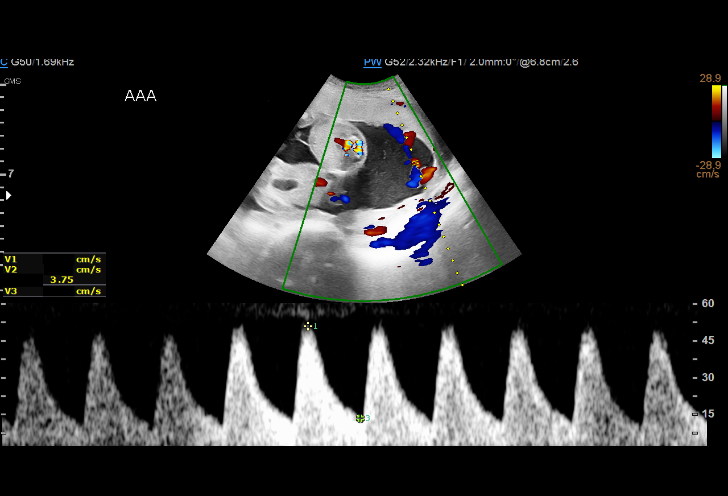
[im 15/34]
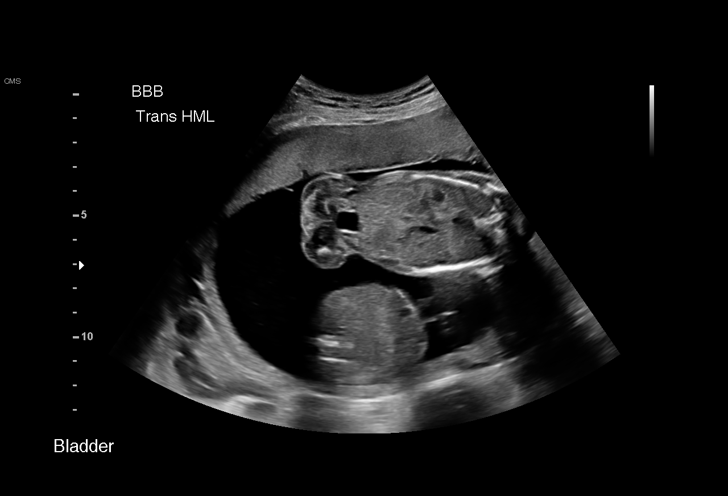
[im 18/34]
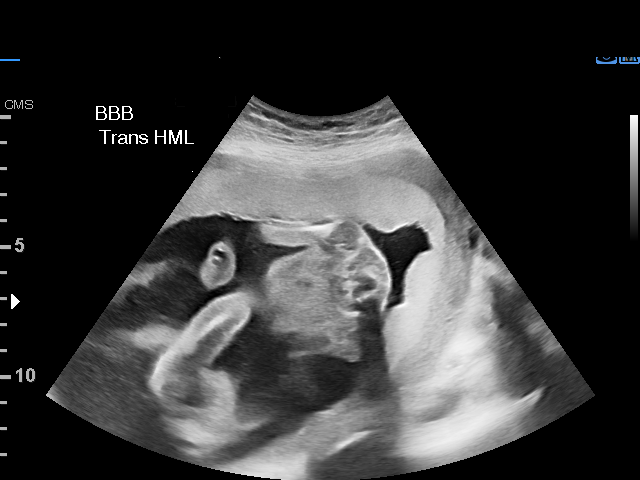
[im 19/34]
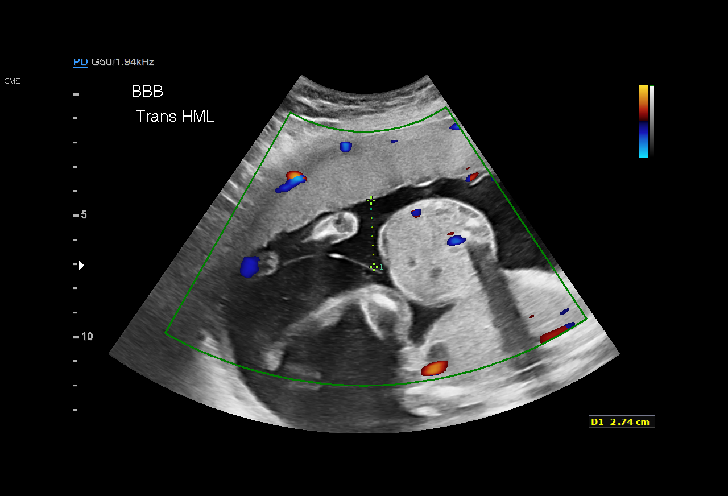
[im 21/34]
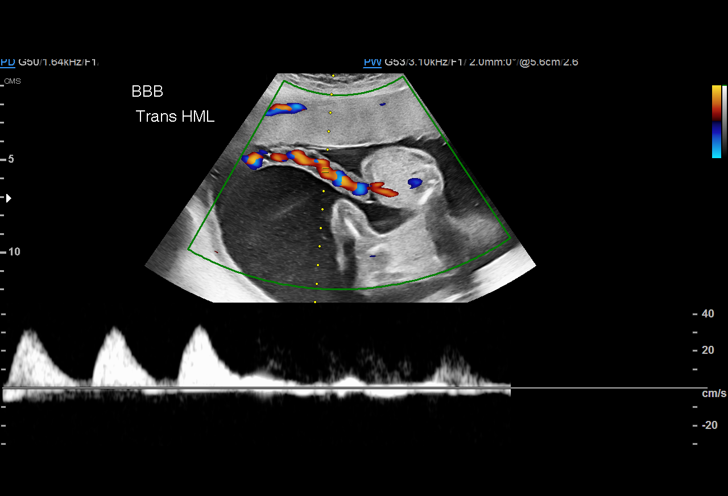
[im 24/34]
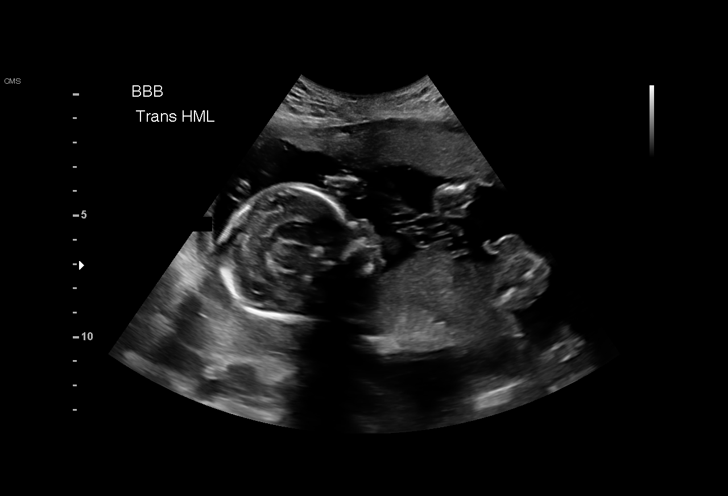
[im 26/34]
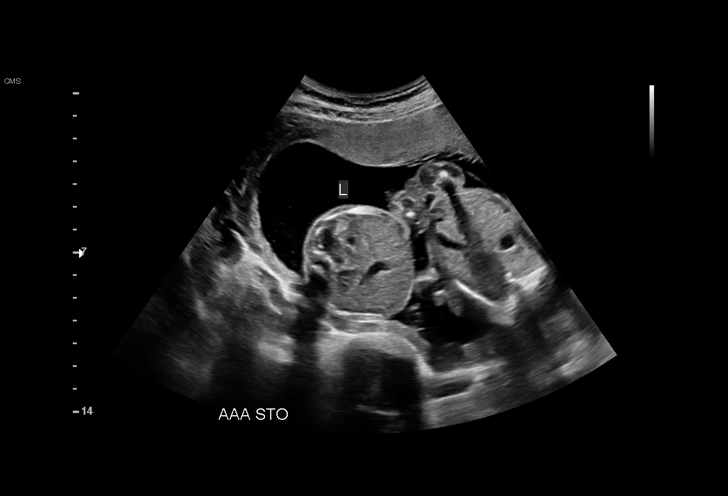
[im 29/34]
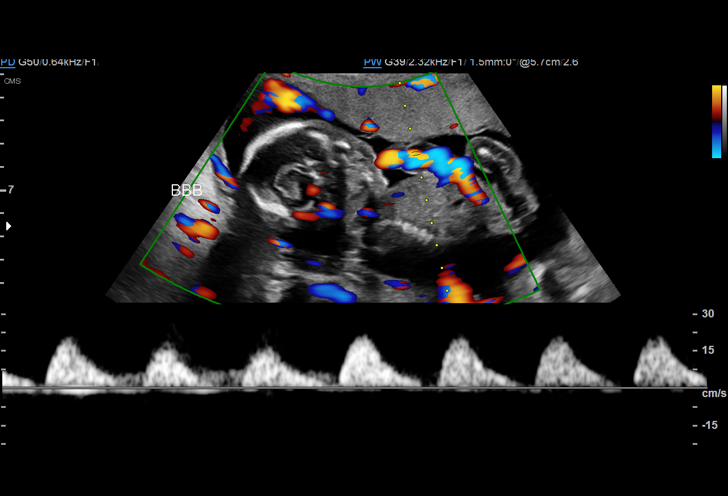
[im 31/34]
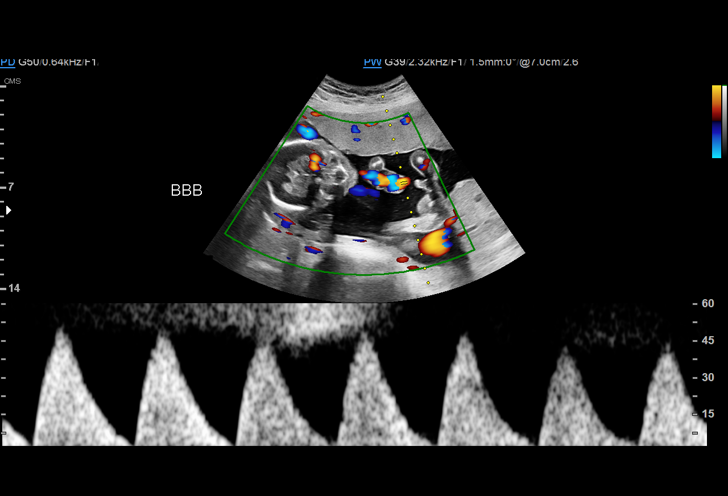
[im 34/34]
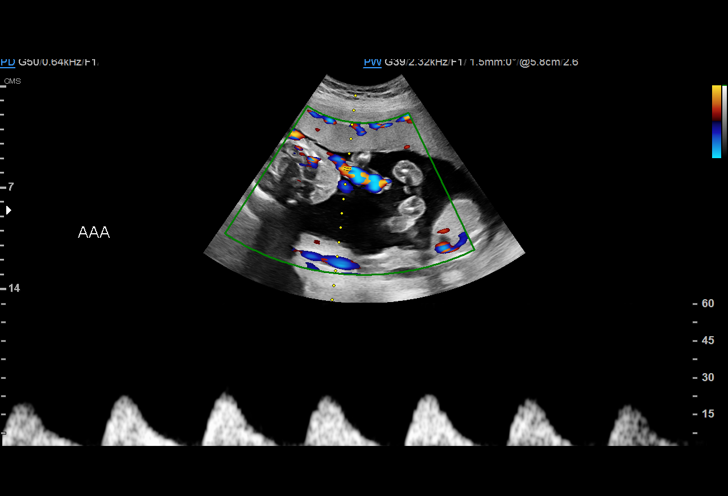

[15 of 28 positions shown; findings below may reference images not displayed]

Centre
[address]

Indications

21 weeks gestation of pregnancy
Late prenatal care, second trimester
Twin pregnancy, Arolf/Adriaens, second trimester
Seizure disorder (none in 10 months-no
meds)
Smoking complicating pregnancy, second
trimester
Polyhydramnios, second trimester,
antepartum condition or complication, fetus 1
OB History

Blood Type:            Height:  5'4"   Weight (lb):  111       BMI:
Gravidity:    3         Term:   2        Prem:   0        SAB:   0
TOP:          0       Ectopic:  0        Living: 2
Fetal Evaluation (Fetus A)

Num Of Fetuses:     2
Fetal Heart         148
Rate(bpm):
Cardiac Activity:   Observed
Fetal Lie:          Lower
Presentation:       Cephalic
Placenta:           Anterior, above cervical os
Amniotic Fluid
AFI FV:      Mild Polyhydramnios

Largest Pocket(cm)
8.8
Gestational Age (Fetus A)

Best:          21w 1d     Det. By:  Previous Ultrasound      EDD:   07/16/18
(02/08/18)
Anatomy (Fetus A)

Stomach:               Appears normal, left   Bladder:                Appears normal
sided
Doppler - Fetal Vessels (Fetus A)

Umbilical Artery
S/D     %tile                                            ADFV    RDFV
4.8       78                                                No      No

Fetal Evaluation (Fetus B)

Num Of Fetuses:     2
Fetal Heart         144
Rate(bpm):
Cardiac Activity:   Observed
Fetal Lie:          Upper
Presentation:       Variable
Placenta:           Anterior, above cervical os

Amniotic Fluid
AFI FV:      Subjectively decreased

Largest Pocket(cm)
2.7
Gestational Age (Fetus B)

Best:          21w 1d     Det. By:  Previous Ultrasound      EDD:   07/16/18
(02/08/18)
Anatomy (Fetus B)

Stomach:               Appears normal, left   Bladder:                Appears normal
sided
Doppler - Fetal Vessels (Fetus B)

Umbilical Artery
ADFV
Yes

Cervix Uterus Adnexa
Cervix
Length:            3.7  cm.
Impression

Monochorionic/diamniotic twin pregnancy at 21+1 weeks
Twin A:
High normal AFV vs mild polyhydramnios
UA dopplers were normal for this GA
Twin B:
Low normal amniotic fluid volume
UA dopplers: mostly absent EDF; some intermitent
continuous diastolic flow

Recommendations

Continue weekly evaluations in CMFM

## 2020-05-28 IMAGING — US US MFM UA ADDL GEST
1 series · 14 of 28 positions shown · non-contrast
Comparison: none

[Series 1: us mfm ua addl gest · 40 acquisitions, 14 frames shown]
[im 2/40]
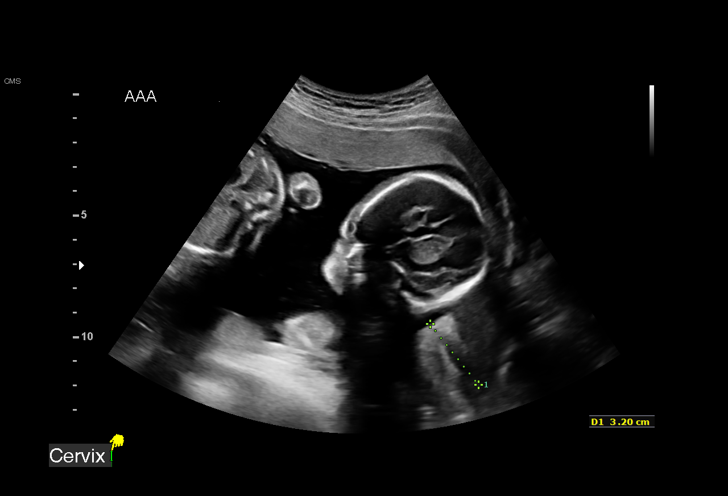
[im 5/40]
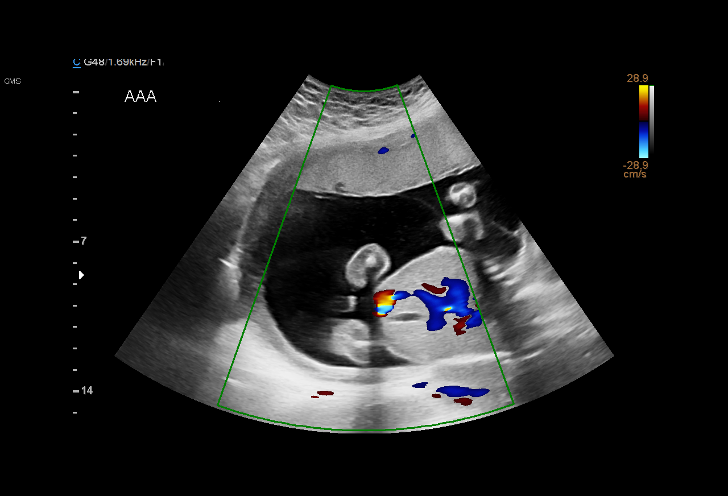
[im 8/40]
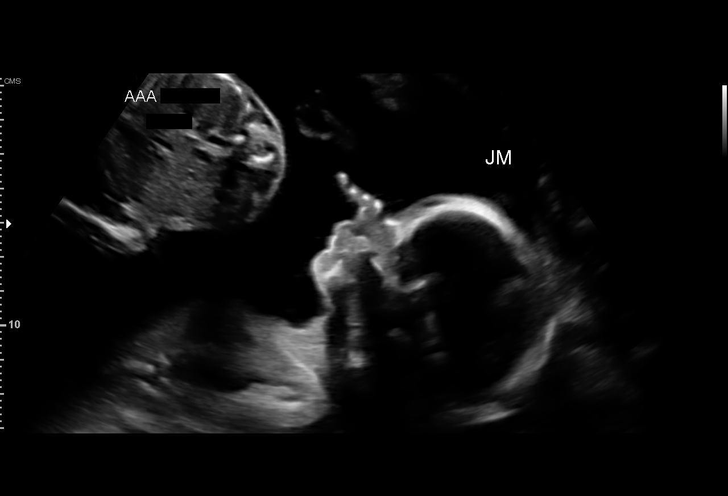
[im 11/40]
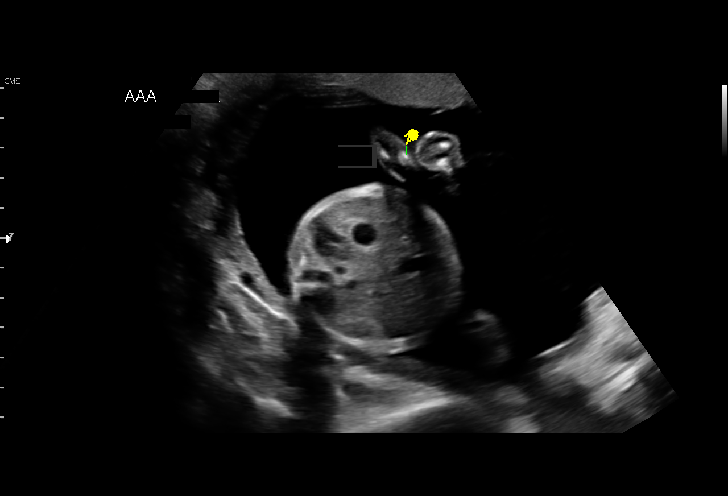
[im 14/40]
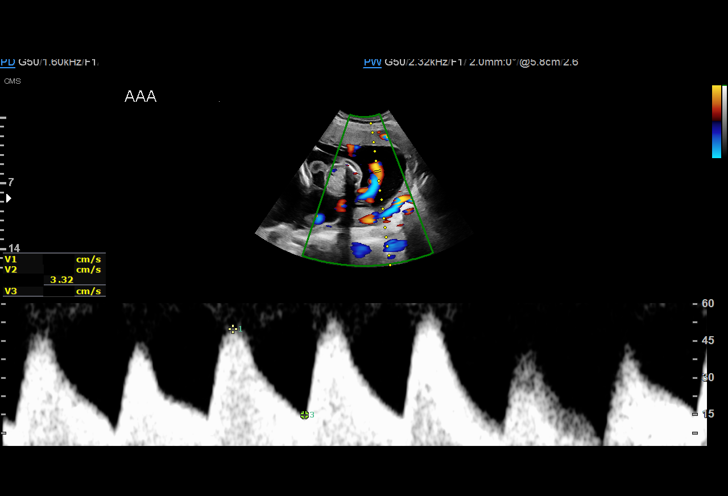
[im 16/40]
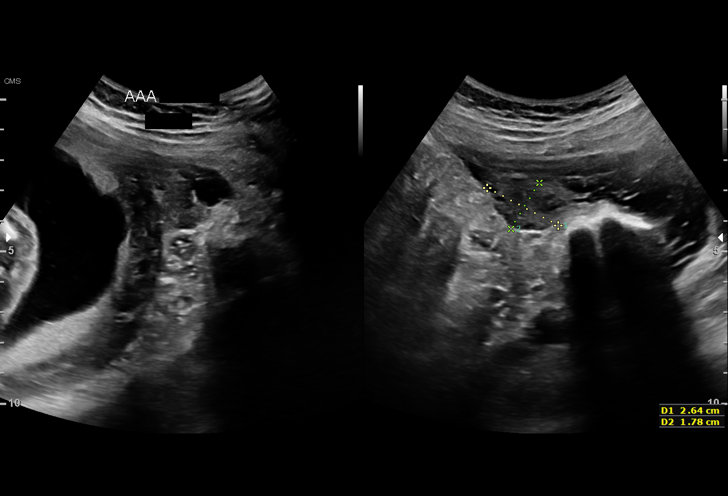
[im 19/40]
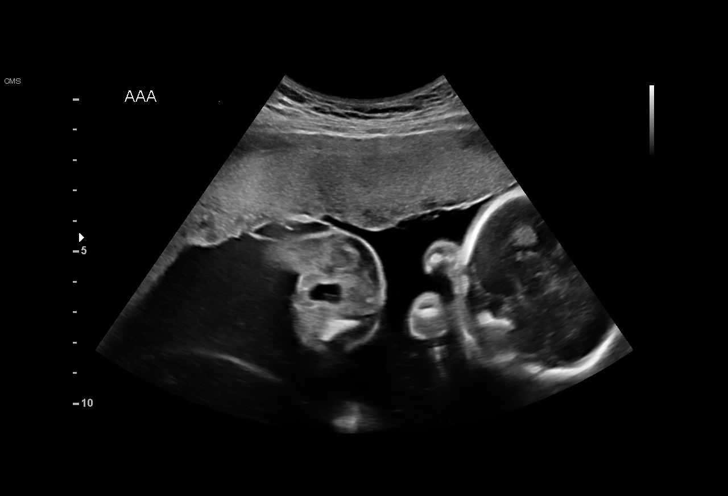
[im 22/40]
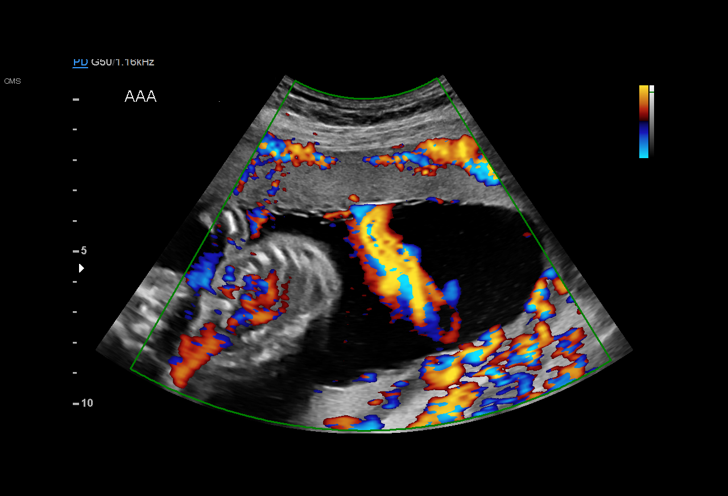
[im 25/40]
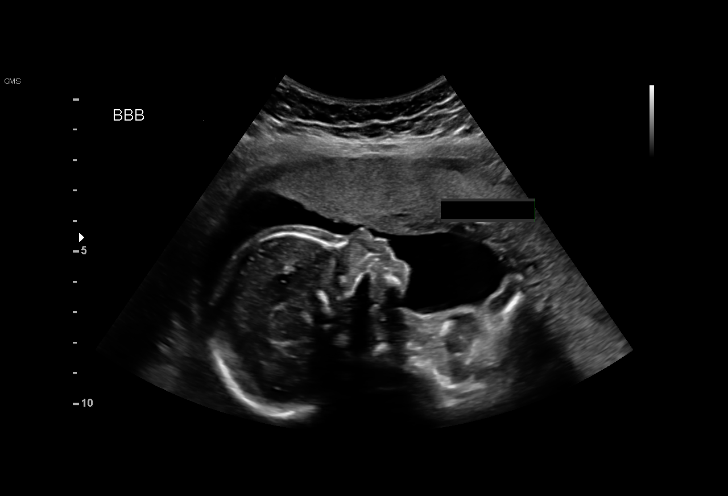
[im 28/40]
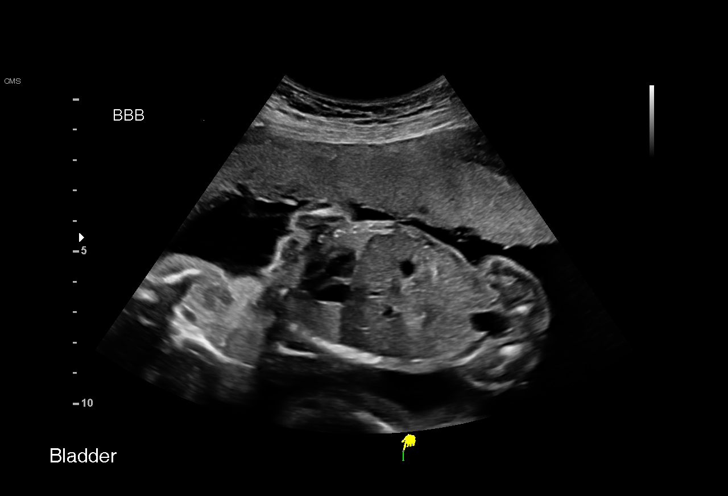
[im 31/40]
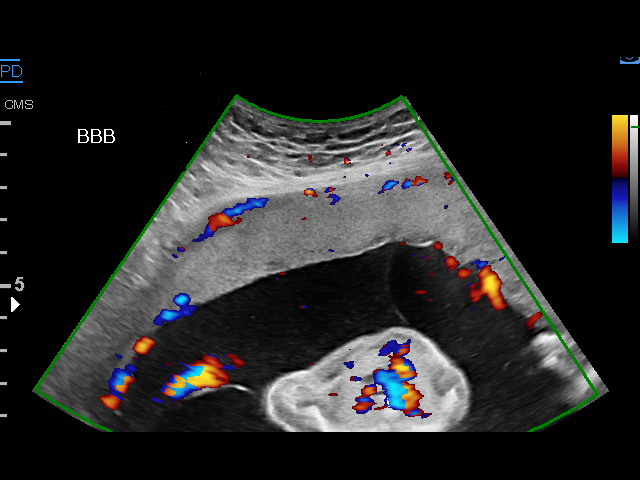
[im 34/40]
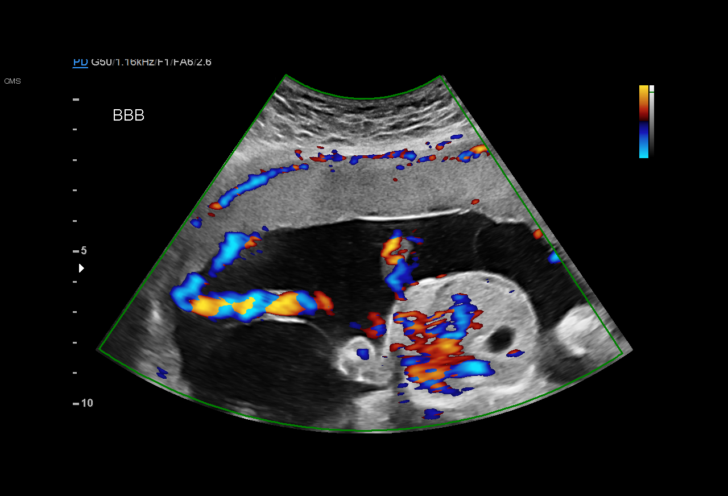
[im 37/40]
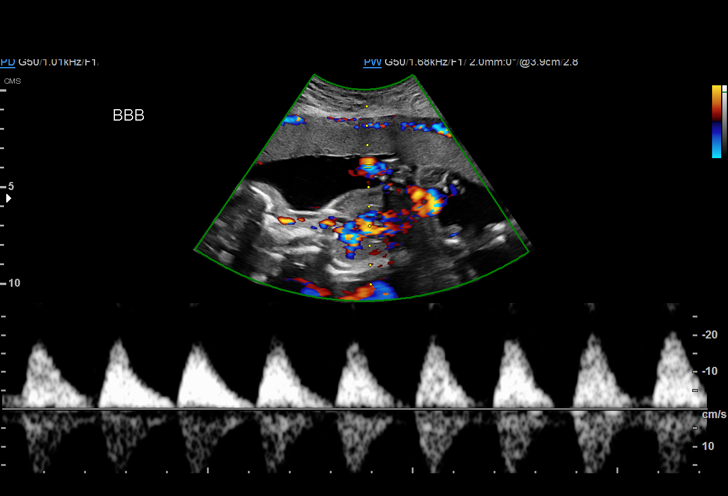
[im 40/40]
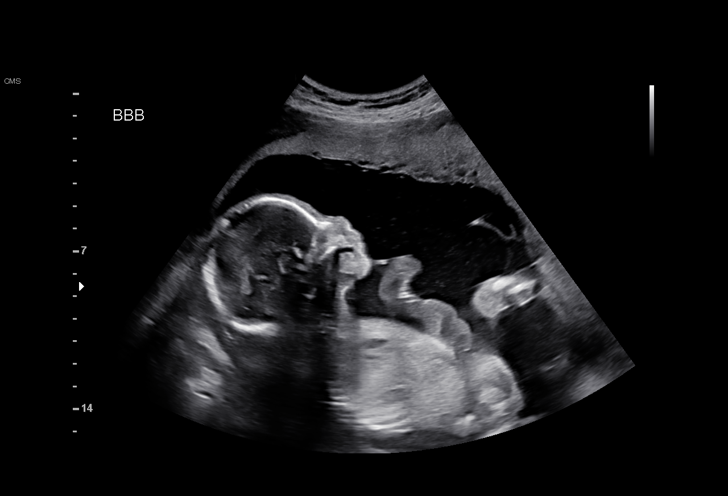

[14 of 28 positions shown; findings below may reference images not displayed]

Centre
[address]

1  WISMA DOSARY              [id number]      [id number]     [id number]
2  WISMA DOSARY              [id number]      [id number]     [id number]
3  WISMA DOSARY              [id number]      [id number]     [id number]
Indications

22 weeks gestation of pregnancy
Late prenatal care, second trimester
Twin pregnancy, Sp/Dunier, second trimester
Seizure disorder (none in 10 months-no
meds)
Smoking complicating pregnancy, second
trimester
Polyhydramnios, second trimester,
antepartum condition or complication, fetus 1
OB History

Blood Type:            Height:  5'4"   Weight (lb):  111       BMI:
Gravidity:    3         Term:   2        Prem:   0        SAB:   0
TOP:          0       Ectopic:  0        Living: 2
Fetal Evaluation (Fetus A)

Num Of Fetuses:     2
Fetal Heart         145
Rate(bpm):
Cardiac Activity:   Observed
Fetal Lie:          Lower Fetus
Presentation:       Cephalic
Placenta:           Anterior, above cervical os
P. Cord Insertion:  Visualized
Membrane Desc:      Dividing Membrane seen - Monochorionic
Amniotic Fluid
AFI FV:      Subjectively upper-normal

Largest Pocket(cm)
8.8
Gestational Age (Fetus A)

Best:          22w 3d     Det. By:  Previous Ultrasound      EDD:   07/16/18
(02/08/18)
Doppler - Fetal Vessels (Fetus A)

Umbilical Artery
S/D     %tile                                            ADFV    RDFV
3.4       37                                                No      No

Fetal Evaluation (Fetus B)

Num Of Fetuses:     2
Fetal Heart         146
Rate(bpm):
Cardiac Activity:   Observed
Fetal Lie:          Upper Fetus
Presentation:       Breech
Placenta:           Anterior, above cervical os
P. Cord Insertion:  Velamentous insertion

Amniotic Fluid
AFI FV:      Subjectively within normal limits

Largest Pocket(cm)
3.12
Gestational Age (Fetus B)

Best:          22w 3d     Det. By:  Previous Ultrasound      EDD:   07/16/18
(02/08/18)
Doppler - Fetal Vessels (Fetus B)

Umbilical Artery
ADFV    RDFV
Yes      No

Cervix Uterus Adnexa

Cervix
Length:            3.2  cm.
Normal appearance by transabdominal scan.

Uterus
No abnormality visualized.

Left Ovary
Within normal limits.
Right Ovary
Not visualized.

Cul De Sac:   No free fluid seen.

Adnexa:       No abnormality visualized.
Impression

Monochorionic/diamniotic twin pregnancy at 22+3 weeks
Twin A:
Elevated MVP at 8.8 cm
UA dopplers were normal for this GA
Twin B:
Normal MVP at 3.1 cm
UA dopplers: mostly absent EDF; some intermitent
continuous diastolic flow
Recommendations

Continue weekly evaluations; recheck fetal growth next week

## 2022-01-16 ENCOUNTER — Emergency Department (HOSPITAL_COMMUNITY)
Admission: EM | Admit: 2022-01-16 | Discharge: 2022-01-16 | Disposition: A | Discharge status: Home / Self Care | Payer: Self-pay | Attending: Emergency Medicine | Admitting: Emergency Medicine

## 2022-01-16 ENCOUNTER — Encounter (HOSPITAL_COMMUNITY): Payer: Self-pay | Admitting: *Deleted

## 2022-01-16 ENCOUNTER — Other Ambulatory Visit: Payer: Self-pay

## 2022-01-16 ENCOUNTER — Emergency Department (HOSPITAL_COMMUNITY): Payer: Self-pay

## 2022-01-16 DIAGNOSIS — Z7982 Long term (current) use of aspirin: Secondary | ICD-10-CM | POA: Insufficient documentation

## 2022-01-16 DIAGNOSIS — Z7951 Long term (current) use of inhaled steroids: Secondary | ICD-10-CM | POA: Insufficient documentation

## 2022-01-16 DIAGNOSIS — S0990XA Unspecified injury of head, initial encounter: Secondary | ICD-10-CM

## 2022-01-16 DIAGNOSIS — F17200 Nicotine dependence, unspecified, uncomplicated: Secondary | ICD-10-CM | POA: Insufficient documentation

## 2022-01-16 DIAGNOSIS — Z91040 Latex allergy status: Secondary | ICD-10-CM | POA: Insufficient documentation

## 2022-01-16 DIAGNOSIS — Y92480 Sidewalk as the place of occurrence of the external cause: Secondary | ICD-10-CM | POA: Insufficient documentation

## 2022-01-16 DIAGNOSIS — S060X0A Concussion without loss of consciousness, initial encounter: Secondary | ICD-10-CM

## 2022-01-16 DIAGNOSIS — R040 Epistaxis: Secondary | ICD-10-CM | POA: Insufficient documentation

## 2022-01-16 DIAGNOSIS — W01198A Fall on same level from slipping, tripping and stumbling with subsequent striking against other object, initial encounter: Secondary | ICD-10-CM | POA: Insufficient documentation

## 2022-01-16 DIAGNOSIS — J45909 Unspecified asthma, uncomplicated: Secondary | ICD-10-CM | POA: Insufficient documentation

## 2022-01-16 MED ORDER — KETOROLAC TROMETHAMINE 15 MG/ML IJ SOLN
15.0000 mg | Freq: Once | INTRAMUSCULAR | Status: AC
Start: 2022-01-16 — End: 2022-01-16
  Administered 2022-01-16: 15 mg via INTRAMUSCULAR
  Filled 2022-01-16: qty 1

## 2022-01-16 MED ORDER — HYDROCODONE-ACETAMINOPHEN 5-325 MG PO TABS
2.0000 | ORAL_TABLET | Freq: Once | ORAL | Status: AC
  Administered 2022-01-16: 2 via ORAL
  Filled 2022-01-16: qty 2

## 2022-01-16 NOTE — ED Provider Notes (Signed)
?[facility]ERGENCY DEPARTMENT ?Provider Note ? ? ?CSN: [id number] ?Arrival date & time: 01/16/22  1325 ? ?  ? ?History ?Chief Complaint  ?Patient presents with  ? Fall  ? ? ?Hayley Peterson is a 36 y.o. female who presents to the emergency department after a head injury secondary to mechanical trip and fall.  Patient was holding her child when she lost her balance and fell forward.  She turned her body and attempt to shield the child from hitting the ground and she smacked her face against the pavement.  She had epistaxis to the nose and a small cut to the nose.  This happened yesterday around 5:15 PM.  Since then she has been having headache and pain over the left orbit.  Bleeding has resolved spontaneously.  No focal weakness or numbness. ? ? ?Fall ? ? ?  ? ?Home Medications ?Prior to Admission medications   ?Medication Sig Start Date End Date Taking? Authorizing Provider  ?albuterol (PROVENTIL HFA;VENTOLIN HFA) 108 (90 BASE) MCG/ACT inhaler Inhale into the lungs every 6 (six) hours as needed for wheezing or shortness of breath. 2 puffs as needed   Yes [provider]  ?Aspirin-Acetaminophen-Caffeine (GOODY HEADACHE PO) Take 1-2 packets by mouth daily.   Yes [provider]  ?FLUoxetine (PROZAC) 40 MG capsule Take 40 mg by mouth daily. Reported on 10/12/2015   Yes [provider]  ?lamoTRIgine (LAMICTAL) 100 MG tablet Take 100 mg by mouth daily.   Yes [provider]  ?   ? ?Allergies    ?Latex   ? ?Review of Systems   ?Review of Systems  ?All other systems reviewed and are negative. ? ?Physical Exam ?Updated Vital Signs ?BP 130/85 (BP Location: Right Arm)   Pulse 75   Temp 99.5 ?F (37.5 ?C) (Oral)   Resp 19   Ht 5\' 2"  (1.575 m)   Wt 57.2 kg   LMP 01/02/2022   SpO2 100%   BMI 23.05 kg/m?  ?Physical Exam ?Vitals and nursing note reviewed.  ?Constitutional:   ?   Appearance: Normal appearance.  ?HENT:  ?   Head: Normocephalic and atraumatic.  ?Eyes:  ?   General:     ?    Right eye: No discharge.     ?   Left eye: No discharge.  ?   Conjunctiva/sclera: Conjunctivae normal.  ?Pulmonary:  ?   Effort: Pulmonary effort is normal.  ?Skin: ?   General: Skin is warm and dry.  ?   Findings: No rash.  ?Neurological:  ?   General: No focal deficit present.  ?   Mental Status: She is alert.  ?   Comments: Cranial nerves II through XII are intact.  Extraocular movements are intact without any evidence of entrapment or nystagmus.  5/5 strength the upper and lower extremities.  Normal sensation to the upper and lower extremities.  ?Psychiatric:     ?   Mood and Affect: Mood normal.     ?   Behavior: Behavior normal.  ? ? ?ED Results / Procedures / Treatments   ?Labs ?(all labs ordered are listed, but only abnormal results are displayed) ?Labs Reviewed - No data to display ? ?EKG ?None ? ?Radiology ?CT Head Wo Contrast ? ?Result Date: 01/16/2022 ?CLINICAL DATA:  Tripped and fell yesterday hitting face on pavement. Now with headache. EXAM: CT HEAD WITHOUT CONTRAST CT MAXILLOFACIAL WITHOUT CONTRAST CT CERVICAL SPINE WITHOUT CONTRAST TECHNIQUE: Multidetector CT imaging of the head, cervical spine, and  maxillofacial structures were performed using the standard protocol without intravenous contrast. Multiplanar CT image reconstructions of the cervical spine and maxillofacial structures were also generated. RADIATION DOSE REDUCTION: This exam was performed according to the departmental dose-optimization program which includes automated exposure control, adjustment of the mA and/or kV according to patient size and/or use of iterative reconstruction technique. COMPARISON:  None. FINDINGS: CT HEAD FINDINGS Brain: No evidence of acute infarction, hemorrhage, hydrocephalus, extra-axial collection or mass lesion/mass effect. Vascular: No hyperdense vessel or unexpected calcification. Skull: Normal. Negative for fracture or focal lesion. Other: None. CT MAXILLOFACIAL FINDINGS Osseous: No fracture or mandibular  dislocation. No destructive process. Orbits: Negative. No traumatic or inflammatory finding. Sinuses: Clear. Soft tissues: Negative. CT CERVICAL SPINE FINDINGS Alignment: Normal. Skull base and vertebrae: No acute fracture. No primary bone lesion or focal pathologic process. Soft tissues and spinal canal: No prevertebral fluid or swelling. No visible canal hematoma. Disc levels: The disc spaces are well preserved. No significant degenerative changes. Upper chest: Negative. Other: None IMPRESSION: 1. No acute intracranial abnormality. 2. No evidence for facial bone fracture. 3. No evidence for acute cervical spine fracture or subluxation. Electronically Signed   By: Signa Kell M.D.   On: 01/16/2022 15:43  ? ?CT Cervical Spine Wo Contrast ? ?Result Date: 01/16/2022 ?CLINICAL DATA:  Tripped and fell yesterday hitting face on pavement. Now with headache. EXAM: CT HEAD WITHOUT CONTRAST CT MAXILLOFACIAL WITHOUT CONTRAST CT CERVICAL SPINE WITHOUT CONTRAST TECHNIQUE: Multidetector CT imaging of the head, cervical spine, and maxillofacial structures were performed using the standard protocol without intravenous contrast. Multiplanar CT image reconstructions of the cervical spine and maxillofacial structures were also generated. RADIATION DOSE REDUCTION: This exam was performed according to the departmental dose-optimization program which includes automated exposure control, adjustment of the mA and/or kV according to patient size and/or use of iterative reconstruction technique. COMPARISON:  None. FINDINGS: CT HEAD FINDINGS Brain: No evidence of acute infarction, hemorrhage, hydrocephalus, extra-axial collection or mass lesion/mass effect. Vascular: No hyperdense vessel or unexpected calcification. Skull: Normal. Negative for fracture or focal lesion. Other: None. CT MAXILLOFACIAL FINDINGS Osseous: No fracture or mandibular dislocation. No destructive process. Orbits: Negative. No traumatic or inflammatory finding.  Sinuses: Clear. Soft tissues: Negative. CT CERVICAL SPINE FINDINGS Alignment: Normal. Skull base and vertebrae: No acute fracture. No primary bone lesion or focal pathologic process. Soft tissues and spinal canal: No prevertebral fluid or swelling. No visible canal hematoma. Disc levels: The disc spaces are well preserved. No significant degenerative changes. Upper chest: Negative. Other: None IMPRESSION: 1. No acute intracranial abnormality. 2. No evidence for facial bone fracture. 3. No evidence for acute cervical spine fracture or subluxation. Electronically Signed   By: Signa Kell M.D.   On: 01/16/2022 15:43  ? ?CT Maxillofacial Wo Contrast ? ?Result Date: 01/16/2022 ?CLINICAL DATA:  Tripped and fell yesterday hitting face on pavement. Now with headache. EXAM: CT HEAD WITHOUT CONTRAST CT MAXILLOFACIAL WITHOUT CONTRAST CT CERVICAL SPINE WITHOUT CONTRAST TECHNIQUE: Multidetector CT imaging of the head, cervical spine, and maxillofacial structures were performed using the standard protocol without intravenous contrast. Multiplanar CT image reconstructions of the cervical spine and maxillofacial structures were also generated. RADIATION DOSE REDUCTION: This exam was performed according to the departmental dose-optimization program which includes automated exposure control, adjustment of the mA and/or kV according to patient size and/or use of iterative reconstruction technique. COMPARISON:  None. FINDINGS: CT HEAD FINDINGS Brain: No evidence of acute infarction, hemorrhage, hydrocephalus, extra-axial collection or mass lesion/mass effect. Vascular:  No hyperdense vessel or unexpected calcification. Skull: Normal. Negative for fracture or focal lesion. Other: None. CT MAXILLOFACIAL FINDINGS Osseous: No fracture or mandibular dislocation. No destructive process. Orbits: Negative. No traumatic or inflammatory finding. Sinuses: Clear. Soft tissues: Negative. CT CERVICAL SPINE FINDINGS Alignment: Normal. Skull base and  vertebrae: No acute fracture. No primary bone lesion or focal pathologic process. Soft tissues and spinal canal: No prevertebral fluid or swelling. No visible canal hematoma. Disc levels: The disc spaces are well p

## 2022-01-16 NOTE — ED Triage Notes (Signed)
Pt tripped and fell yesterday afternoon while carrying a child, pt tried to turn to keep from injuring the child, pt hit her face pavement.  Pt with HA "pounding" per pt. + nausea and emesis before the fall but worse since fall. Lac  to nose and bruising noted to right knee from the fall. Denies LOC.  ?

## 2022-01-16 NOTE — Discharge Instructions (Signed)
Please follow-up with primary care doctor.  You may return to the emergency department for any worsening symptoms.  Please keep a look out for the red flag symptoms that we discussed today. ?

## 2022-09-21 ENCOUNTER — Other Ambulatory Visit (HOSPITAL_COMMUNITY): Payer: Self-pay

## 2022-09-21 MED ORDER — BUPRENORPHINE HCL 8 MG SL SUBL
SUBLINGUAL_TABLET | SUBLINGUAL | 0 refills | Status: DC
  Filled 2022-09-21: qty 35, 30d supply, fill #0

## 2022-10-19 ENCOUNTER — Other Ambulatory Visit (HOSPITAL_COMMUNITY): Payer: Self-pay

## 2022-10-19 MED ORDER — BUPRENORPHINE HCL 8 MG SL SUBL
SUBLINGUAL_TABLET | SUBLINGUAL | 0 refills | Status: DC
  Filled 2022-10-19: qty 35, 30d supply, fill #0

## 2022-10-24 ENCOUNTER — Other Ambulatory Visit (HOSPITAL_COMMUNITY): Payer: Self-pay

## 2022-10-24 MED ORDER — BUSPIRONE HCL 15 MG PO TABS
ORAL_TABLET | ORAL | 0 refills | Status: AC
  Filled 2022-10-24: qty 90, 30d supply, fill #0

## 2022-10-24 MED ORDER — LAMOTRIGINE 5 MG PO CHEW
CHEWABLE_TABLET | ORAL | 0 refills | Status: AC
  Filled 2022-10-24: qty 140, 56d supply, fill #0

## 2022-10-25 ENCOUNTER — Other Ambulatory Visit (HOSPITAL_COMMUNITY): Payer: Self-pay

## 2022-11-07 ENCOUNTER — Other Ambulatory Visit (HOSPITAL_COMMUNITY): Payer: Self-pay

## 2022-11-07 MED ORDER — GABAPENTIN 300 MG PO CAPS
300.0000 mg | ORAL_CAPSULE | Freq: Three times a day (TID) | ORAL | 0 refills | Status: DC | PRN
  Filled 2022-11-07: qty 90, 30d supply, fill #0

## 2022-11-16 ENCOUNTER — Other Ambulatory Visit (HOSPITAL_COMMUNITY): Payer: Self-pay

## 2022-11-16 MED ORDER — BUPRENORPHINE HCL 8 MG SL SUBL
SUBLINGUAL_TABLET | SUBLINGUAL | 0 refills | Status: DC
  Filled 2022-11-16: qty 35, 30d supply, fill #0

## 2022-11-21 ENCOUNTER — Other Ambulatory Visit (HOSPITAL_COMMUNITY): Payer: Self-pay

## 2022-11-21 MED ORDER — GABAPENTIN 300 MG PO CAPS
300.0000 mg | ORAL_CAPSULE | Freq: Three times a day (TID) | ORAL | 0 refills | Status: DC | PRN
  Filled 2022-11-21 – 2022-12-05 (×2): qty 90, 30d supply, fill #0

## 2022-11-21 MED ORDER — LORAZEPAM 0.5 MG PO TABS
0.5000 mg | ORAL_TABLET | Freq: Every day | ORAL | 0 refills | Status: DC | PRN
  Filled 2022-11-21: qty 10, 14d supply, fill #0

## 2022-11-21 MED ORDER — LAMOTRIGINE 100 MG PO TABS
100.0000 mg | ORAL_TABLET | Freq: Every day | ORAL | 0 refills | Status: AC
  Filled 2022-11-21: qty 30, 30d supply, fill #0

## 2022-12-05 ENCOUNTER — Other Ambulatory Visit (HOSPITAL_COMMUNITY): Payer: Self-pay

## 2022-12-05 MED ORDER — LAMOTRIGINE 200 MG PO TABS
200.0000 mg | ORAL_TABLET | Freq: Every evening | ORAL | 0 refills | Status: DC
  Filled 2022-12-05: qty 30, 30d supply, fill #0

## 2022-12-05 MED ORDER — QUETIAPINE FUMARATE 50 MG PO TABS
50.0000 mg | ORAL_TABLET | Freq: Every evening | ORAL | 0 refills | Status: DC
  Filled 2022-12-05: qty 30, 30d supply, fill #0

## 2022-12-14 ENCOUNTER — Other Ambulatory Visit (HOSPITAL_COMMUNITY): Payer: Self-pay

## 2022-12-14 MED ORDER — BUPRENORPHINE HCL 8 MG SL SUBL
8.0000 mg | SUBLINGUAL_TABLET | Freq: Two times a day (BID) | SUBLINGUAL | 0 refills | Status: DC
  Filled 2022-12-14: qty 60, 30d supply, fill #0

## 2022-12-19 ENCOUNTER — Other Ambulatory Visit (HOSPITAL_COMMUNITY): Payer: Self-pay

## 2022-12-19 MED ORDER — LITHIUM CARBONATE ER 300 MG PO TBCR
300.0000 mg | EXTENDED_RELEASE_TABLET | ORAL | 0 refills | Status: AC
  Filled 2022-12-19: qty 90, 30d supply, fill #0

## 2022-12-19 MED ORDER — LORAZEPAM 0.5 MG PO TABS
0.5000 mg | ORAL_TABLET | Freq: Every day | ORAL | 0 refills | Status: DC | PRN
  Filled 2022-12-19: qty 10, 14d supply, fill #0

## 2023-01-02 ENCOUNTER — Other Ambulatory Visit (HOSPITAL_COMMUNITY): Payer: Self-pay

## 2023-01-02 MED ORDER — QUETIAPINE FUMARATE 50 MG PO TABS
50.0000 mg | ORAL_TABLET | Freq: Every evening | ORAL | 0 refills | Status: AC
  Filled 2023-01-02: qty 30, 30d supply, fill #0

## 2023-01-02 MED ORDER — GABAPENTIN 300 MG PO CAPS
300.0000 mg | ORAL_CAPSULE | Freq: Three times a day (TID) | ORAL | 0 refills | Status: DC | PRN
  Filled 2023-01-02: qty 90, 30d supply, fill #0

## 2023-01-02 MED ORDER — LAMOTRIGINE 200 MG PO TABS
200.0000 mg | ORAL_TABLET | Freq: Every evening | ORAL | 0 refills | Status: DC
  Filled 2023-01-02: qty 30, 30d supply, fill #0

## 2023-01-02 MED ORDER — LORAZEPAM 0.5 MG PO TABS
0.5000 mg | ORAL_TABLET | Freq: Every day | ORAL | 0 refills | Status: DC | PRN
  Filled 2023-01-02: qty 20, 20d supply, fill #0

## 2023-01-11 ENCOUNTER — Other Ambulatory Visit (HOSPITAL_COMMUNITY): Payer: Self-pay

## 2023-01-11 MED ORDER — BUPRENORPHINE HCL 8 MG SL SUBL
SUBLINGUAL_TABLET | SUBLINGUAL | 0 refills | Status: DC
  Filled 2023-01-11: qty 60, 30d supply, fill #0

## 2023-01-24 ENCOUNTER — Other Ambulatory Visit (HOSPITAL_COMMUNITY): Payer: Self-pay

## 2023-01-24 MED ORDER — ALBUTEROL SULFATE HFA 108 (90 BASE) MCG/ACT IN AERS
INHALATION_SPRAY | RESPIRATORY_TRACT | 0 refills | Status: AC
Start: 2023-01-24 — End: ?
  Filled 2023-01-24: qty 18, 17d supply, fill #0

## 2023-01-24 MED ORDER — ARIPIPRAZOLE 5 MG PO TABS
5.0000 mg | ORAL_TABLET | Freq: Every evening | ORAL | 0 refills | Status: DC
  Filled 2023-01-24: qty 30, 30d supply, fill #0

## 2023-01-24 MED ORDER — ALBUTEROL SULFATE 4 MG PO TABS: ORAL_TABLET | ORAL | 0 refills | Status: DC

## 2023-01-25 ENCOUNTER — Other Ambulatory Visit (HOSPITAL_COMMUNITY): Payer: Self-pay

## 2023-01-31 ENCOUNTER — Other Ambulatory Visit (HOSPITAL_COMMUNITY): Payer: Self-pay

## 2023-01-31 MED ORDER — ARIPIPRAZOLE 5 MG PO TABS
5.0000 mg | ORAL_TABLET | Freq: Two times a day (BID) | ORAL | 0 refills | Status: AC
  Filled 2023-01-31: qty 60, 30d supply, fill #0

## 2023-01-31 MED ORDER — LORAZEPAM 0.5 MG PO TABS
0.5000 mg | ORAL_TABLET | Freq: Every day | ORAL | 0 refills | Status: DC | PRN
  Filled 2023-01-31: qty 20, 14d supply, fill #0

## 2023-01-31 MED ORDER — GABAPENTIN 300 MG PO CAPS
300.0000 mg | ORAL_CAPSULE | Freq: Three times a day (TID) | ORAL | 0 refills | Status: DC | PRN
  Filled 2023-01-31: qty 90, 30d supply, fill #0

## 2023-01-31 MED ORDER — LAMOTRIGINE 200 MG PO TABS
200.0000 mg | ORAL_TABLET | Freq: Every evening | ORAL | 0 refills | Status: DC
  Filled 2023-01-31: qty 30, 30d supply, fill #0

## 2023-02-08 ENCOUNTER — Other Ambulatory Visit (HOSPITAL_COMMUNITY): Payer: Self-pay

## 2023-02-08 MED ORDER — BUPRENORPHINE HCL 8 MG SL SUBL
SUBLINGUAL_TABLET | SUBLINGUAL | 0 refills | Status: DC
  Filled 2023-02-08: qty 60, 30d supply, fill #0

## 2023-02-14 ENCOUNTER — Other Ambulatory Visit (HOSPITAL_COMMUNITY): Payer: Self-pay

## 2023-02-14 MED ORDER — GABAPENTIN 300 MG PO CAPS
300.0000 mg | ORAL_CAPSULE | Freq: Three times a day (TID) | ORAL | 0 refills | Status: DC | PRN
  Filled 2023-02-14 – 2023-12-20 (×2): qty 90, 30d supply, fill #0

## 2023-02-14 MED ORDER — LAMOTRIGINE 200 MG PO TABS
200.0000 mg | ORAL_TABLET | Freq: Every evening | ORAL | 0 refills | Status: AC
  Filled 2023-02-14: qty 30, 30d supply, fill #0

## 2023-02-14 MED ORDER — ARIPIPRAZOLE 10 MG PO TABS
10.0000 mg | ORAL_TABLET | Freq: Every day | ORAL | 0 refills | Status: DC
  Filled 2023-02-14: qty 30, 30d supply, fill #0

## 2023-02-14 MED ORDER — LORAZEPAM 0.5 MG PO TABS
0.5000 mg | ORAL_TABLET | Freq: Every day | ORAL | 0 refills | Status: DC | PRN
  Filled 2023-02-14: qty 30, 14d supply, fill #0

## 2023-03-08 ENCOUNTER — Other Ambulatory Visit (HOSPITAL_COMMUNITY): Payer: Self-pay

## 2023-03-08 MED ORDER — BUPRENORPHINE HCL 8 MG SL SUBL
SUBLINGUAL_TABLET | SUBLINGUAL | 0 refills | Status: DC
  Filled 2023-03-08: qty 60, 30d supply, fill #0

## 2023-03-08 MED ORDER — GABAPENTIN 300 MG PO CAPS
300.0000 mg | ORAL_CAPSULE | Freq: Three times a day (TID) | ORAL | 0 refills | Status: AC | PRN
  Filled 2023-03-08: qty 90, 30d supply, fill #0

## 2023-03-14 ENCOUNTER — Other Ambulatory Visit (HOSPITAL_COMMUNITY): Payer: Self-pay

## 2023-03-14 MED ORDER — ARIPIPRAZOLE 10 MG PO TABS
10.0000 mg | ORAL_TABLET | Freq: Every day | ORAL | 0 refills | Status: DC
  Filled 2023-03-14: qty 30, 30d supply, fill #0

## 2023-03-14 MED ORDER — LORAZEPAM 0.5 MG PO TABS
0.5000 mg | ORAL_TABLET | Freq: Every day | ORAL | 0 refills | Status: AC | PRN
  Filled 2023-03-14: qty 30, 15d supply, fill #0

## 2023-03-14 MED ORDER — LAMOTRIGINE 200 MG PO TABS
200.0000 mg | ORAL_TABLET | Freq: Every evening | ORAL | 0 refills | Status: AC
  Filled 2023-03-14: qty 30, 30d supply, fill #0

## 2023-04-05 ENCOUNTER — Other Ambulatory Visit (HOSPITAL_COMMUNITY): Payer: Self-pay

## 2023-04-05 MED ORDER — BUPRENORPHINE HCL 8 MG SL SUBL
8.0000 mg | SUBLINGUAL_TABLET | Freq: Two times a day (BID) | SUBLINGUAL | 0 refills | Status: DC
  Filled 2023-04-05: qty 60, 30d supply, fill #0

## 2023-04-05 MED ORDER — GABAPENTIN 300 MG PO CAPS
300.0000 mg | ORAL_CAPSULE | Freq: Three times a day (TID) | ORAL | 0 refills | Status: AC | PRN
  Filled 2023-04-05: qty 90, 30d supply, fill #0

## 2023-04-05 MED ORDER — ARIPIPRAZOLE 10 MG PO TABS: 10.0000 mg | ORAL_TABLET | Freq: Every day | ORAL | 0 refills | Status: AC

## 2023-04-05 MED ORDER — LAMOTRIGINE 200 MG PO TABS: 200.0000 mg | ORAL_TABLET | Freq: Every day | ORAL | 0 refills | Status: AC

## 2023-04-12 ENCOUNTER — Other Ambulatory Visit (HOSPITAL_COMMUNITY): Payer: Self-pay

## 2023-04-12 MED ORDER — LORAZEPAM 0.5 MG PO TABS
0.5000 mg | ORAL_TABLET | Freq: Every day | ORAL | 0 refills | Status: AC | PRN
  Filled 2023-04-12: qty 30, 14d supply, fill #0

## 2023-05-03 ENCOUNTER — Other Ambulatory Visit (HOSPITAL_COMMUNITY): Payer: Self-pay

## 2023-05-03 MED ORDER — BUPRENORPHINE HCL 8 MG SL SUBL
SUBLINGUAL_TABLET | SUBLINGUAL | 0 refills | Status: DC
  Filled 2023-05-03: qty 60, 30d supply, fill #0

## 2023-05-10 ENCOUNTER — Other Ambulatory Visit (HOSPITAL_COMMUNITY): Payer: Self-pay

## 2023-05-10 MED ORDER — GABAPENTIN 300 MG PO CAPS
300.0000 mg | ORAL_CAPSULE | Freq: Three times a day (TID) | ORAL | 0 refills | Status: AC | PRN
  Filled 2023-05-10: qty 90, 30d supply, fill #0

## 2023-05-10 MED ORDER — LORAZEPAM 0.5 MG PO TABS
0.5000 mg | ORAL_TABLET | Freq: Every day | ORAL | 0 refills | Status: AC | PRN
  Filled 2023-05-10: qty 30, 30d supply, fill #0

## 2023-05-10 MED ORDER — LAMOTRIGINE 200 MG PO TABS
200.0000 mg | ORAL_TABLET | Freq: Every day | ORAL | 0 refills | Status: AC
  Filled 2023-05-10: qty 30, 30d supply, fill #0

## 2023-05-10 MED ORDER — ARIPIPRAZOLE 10 MG PO TABS
10.0000 mg | ORAL_TABLET | Freq: Every day | ORAL | 0 refills | Status: AC
  Filled 2023-05-10: qty 30, 30d supply, fill #0

## 2023-05-31 ENCOUNTER — Other Ambulatory Visit (HOSPITAL_COMMUNITY): Payer: Self-pay

## 2023-05-31 MED ORDER — BUPRENORPHINE HCL 8 MG SL SUBL
8.0000 mg | SUBLINGUAL_TABLET | Freq: Two times a day (BID) | SUBLINGUAL | 0 refills | Status: DC
Start: 2023-05-31 — End: 2023-06-28
  Filled 2023-05-31: qty 60, 30d supply, fill #0

## 2023-06-07 ENCOUNTER — Other Ambulatory Visit (HOSPITAL_COMMUNITY): Payer: Self-pay

## 2023-06-07 MED ORDER — ARIPIPRAZOLE 10 MG PO TABS
10.0000 mg | ORAL_TABLET | Freq: Every day | ORAL | 0 refills | Status: AC
  Filled 2023-06-07: qty 30, 30d supply, fill #0

## 2023-06-07 MED ORDER — GABAPENTIN 300 MG PO CAPS
300.0000 mg | ORAL_CAPSULE | Freq: Three times a day (TID) | ORAL | 0 refills | Status: AC | PRN
  Filled 2023-06-07: qty 90, 30d supply, fill #0

## 2023-06-07 MED ORDER — LORAZEPAM 0.5 MG PO TABS
0.5000 mg | ORAL_TABLET | Freq: Every day | ORAL | 0 refills | Status: AC | PRN
  Filled 2023-06-07: qty 30, 30d supply, fill #0

## 2023-06-07 MED ORDER — LAMOTRIGINE 200 MG PO TABS
200.0000 mg | ORAL_TABLET | Freq: Every evening | ORAL | 0 refills | Status: AC
  Filled 2023-06-07: qty 30, 30d supply, fill #0

## 2023-06-28 ENCOUNTER — Other Ambulatory Visit (HOSPITAL_COMMUNITY): Payer: Self-pay

## 2023-06-28 MED ORDER — BUPRENORPHINE HCL 8 MG SL SUBL
SUBLINGUAL_TABLET | SUBLINGUAL | 0 refills | Status: DC
Start: 2023-06-28 — End: 2023-07-26
  Filled 2023-06-28: qty 60, 28d supply, fill #0

## 2023-07-05 ENCOUNTER — Other Ambulatory Visit (HOSPITAL_COMMUNITY): Payer: Self-pay

## 2023-07-05 MED ORDER — LAMOTRIGINE 200 MG PO TABS
200.0000 mg | ORAL_TABLET | Freq: Every day | ORAL | 0 refills | Status: AC
  Filled 2023-07-05: qty 30, 30d supply, fill #0

## 2023-07-05 MED ORDER — PREGABALIN 50 MG PO CAPS
50.0000 mg | ORAL_CAPSULE | Freq: Two times a day (BID) | ORAL | 0 refills | Status: AC
  Filled 2023-07-05: qty 28, 14d supply, fill #0

## 2023-07-05 MED ORDER — ARIPIPRAZOLE 10 MG PO TABS
ORAL_TABLET | ORAL | 0 refills | Status: AC
  Filled 2023-07-05: qty 30, 30d supply, fill #0

## 2023-07-05 MED ORDER — LORAZEPAM 0.5 MG PO TABS
0.5000 mg | ORAL_TABLET | Freq: Every day | ORAL | 0 refills | Status: AC | PRN
Start: 2023-07-05 — End: ?
  Filled 2023-07-05: qty 30, 30d supply, fill #0

## 2023-07-19 ENCOUNTER — Other Ambulatory Visit (HOSPITAL_COMMUNITY): Payer: Self-pay

## 2023-07-19 MED ORDER — PREGABALIN 75 MG PO CAPS
75.0000 mg | ORAL_CAPSULE | Freq: Two times a day (BID) | ORAL | 0 refills | Status: DC
  Filled 2023-07-19: qty 28, 14d supply, fill #0

## 2023-07-26 ENCOUNTER — Other Ambulatory Visit (HOSPITAL_COMMUNITY): Payer: Self-pay

## 2023-07-26 MED ORDER — BUPRENORPHINE HCL 8 MG SL SUBL
8.0000 mg | SUBLINGUAL_TABLET | Freq: Two times a day (BID) | SUBLINGUAL | 0 refills | Status: DC
Start: 2023-07-26 — End: 2023-08-02
  Filled 2023-07-26: qty 60, 28d supply, fill #0

## 2023-08-02 ENCOUNTER — Other Ambulatory Visit (HOSPITAL_COMMUNITY): Payer: Self-pay

## 2023-08-02 MED ORDER — LORAZEPAM 0.5 MG PO TABS
0.5000 mg | ORAL_TABLET | Freq: Every day | ORAL | 0 refills | Status: AC | PRN
  Filled 2023-08-02: qty 30, 30d supply, fill #0

## 2023-08-02 MED ORDER — GABAPENTIN 300 MG PO CAPS
300.0000 mg | ORAL_CAPSULE | Freq: Three times a day (TID) | ORAL | 0 refills | Status: AC | PRN
  Filled 2023-08-02: qty 90, 30d supply, fill #0

## 2023-08-02 MED ORDER — PREGABALIN 75 MG PO CAPS
75.0000 mg | ORAL_CAPSULE | Freq: Two times a day (BID) | ORAL | 0 refills | Status: DC
Start: 2023-08-02 — End: 2023-08-30
  Filled 2023-08-02: qty 60, 30d supply, fill #0

## 2023-08-02 MED ORDER — BUPRENORPHINE HCL 8 MG SL SUBL
SUBLINGUAL_TABLET | SUBLINGUAL | 0 refills | Status: AC
  Filled 2023-11-20: qty 60, 30d supply, fill #0

## 2023-08-02 MED ORDER — LAMOTRIGINE 200 MG PO TABS
200.0000 mg | ORAL_TABLET | Freq: Every day | ORAL | 0 refills | Status: AC
  Filled 2023-08-02: qty 30, 30d supply, fill #0

## 2023-08-14 ENCOUNTER — Other Ambulatory Visit (HOSPITAL_COMMUNITY): Payer: Self-pay

## 2023-08-23 ENCOUNTER — Other Ambulatory Visit (HOSPITAL_COMMUNITY): Payer: Self-pay

## 2023-08-23 MED ORDER — BUPRENORPHINE HCL 8 MG SL SUBL
8.0000 mg | SUBLINGUAL_TABLET | Freq: Two times a day (BID) | SUBLINGUAL | 0 refills | Status: AC
  Filled 2023-08-23: qty 60, 30d supply, fill #0

## 2023-08-30 ENCOUNTER — Other Ambulatory Visit (HOSPITAL_COMMUNITY): Payer: Self-pay

## 2023-08-30 MED ORDER — PREGABALIN 75 MG PO CAPS
75.0000 mg | ORAL_CAPSULE | Freq: Two times a day (BID) | ORAL | 0 refills | Status: AC
  Filled 2023-08-30: qty 60, 30d supply, fill #0

## 2023-08-30 MED ORDER — LORAZEPAM 0.5 MG PO TABS
0.5000 mg | ORAL_TABLET | Freq: Every day | ORAL | 0 refills | Status: AC | PRN
  Filled 2023-08-30: qty 30, 30d supply, fill #0

## 2023-08-30 MED ORDER — ARIPIPRAZOLE 10 MG PO TABS
ORAL_TABLET | ORAL | 0 refills | Status: AC
  Filled 2023-08-30: qty 45, 38d supply, fill #0

## 2023-08-30 MED ORDER — LAMOTRIGINE 200 MG PO TABS
200.0000 mg | ORAL_TABLET | Freq: Every day | ORAL | 0 refills | Status: AC
  Filled 2023-08-30: qty 30, 30d supply, fill #0

## 2023-09-19 ENCOUNTER — Other Ambulatory Visit (HOSPITAL_COMMUNITY): Payer: Self-pay

## 2023-09-19 MED ORDER — BUPRENORPHINE HCL 8 MG SL SUBL
8.0000 mg | SUBLINGUAL_TABLET | Freq: Two times a day (BID) | SUBLINGUAL | 0 refills | Status: AC
  Filled 2023-09-20: qty 60, 30d supply, fill #0

## 2023-09-21 ENCOUNTER — Other Ambulatory Visit: Payer: Self-pay

## 2023-09-21 ENCOUNTER — Other Ambulatory Visit (HOSPITAL_COMMUNITY): Payer: Self-pay

## 2023-09-23 ENCOUNTER — Other Ambulatory Visit (HOSPITAL_COMMUNITY): Payer: Self-pay

## 2023-09-27 ENCOUNTER — Other Ambulatory Visit (HOSPITAL_COMMUNITY): Payer: Self-pay

## 2023-09-27 ENCOUNTER — Other Ambulatory Visit: Payer: Self-pay

## 2023-09-27 MED ORDER — GABAPENTIN 300 MG PO CAPS
300.0000 mg | ORAL_CAPSULE | Freq: Three times a day (TID) | ORAL | 0 refills | Status: AC | PRN
  Filled 2023-09-27 (×2): qty 90, 30d supply, fill #0

## 2023-09-27 MED ORDER — CLONAZEPAM 0.5 MG PO TABS
0.5000 mg | ORAL_TABLET | Freq: Every day | ORAL | 0 refills | Status: AC | PRN
  Filled 2023-09-27 (×2): qty 14, 14d supply, fill #0

## 2023-10-11 ENCOUNTER — Other Ambulatory Visit (HOSPITAL_COMMUNITY): Payer: Self-pay

## 2023-10-11 MED ORDER — LAMOTRIGINE 200 MG PO TABS
200.0000 mg | ORAL_TABLET | Freq: Every day | ORAL | 0 refills | Status: AC
  Filled 2023-10-11: qty 90, 90d supply, fill #0

## 2023-10-11 MED ORDER — GABAPENTIN 300 MG PO CAPS
300.0000 mg | ORAL_CAPSULE | Freq: Three times a day (TID) | ORAL | 0 refills | Status: AC
  Filled 2023-10-11 – 2023-10-19 (×2): qty 90, 30d supply, fill #0

## 2023-10-11 MED ORDER — ARIPIPRAZOLE 10 MG PO TABS
ORAL_TABLET | ORAL | 0 refills | Status: AC
  Filled 2023-10-11: qty 45, 38d supply, fill #0

## 2023-10-11 MED ORDER — DIAZEPAM 5 MG PO TABS
5.0000 mg | ORAL_TABLET | Freq: Two times a day (BID) | ORAL | 0 refills | Status: AC | PRN
  Filled 2023-10-11: qty 28, 14d supply, fill #0

## 2023-10-19 ENCOUNTER — Other Ambulatory Visit (HOSPITAL_COMMUNITY): Payer: Self-pay

## 2023-10-25 ENCOUNTER — Other Ambulatory Visit (HOSPITAL_COMMUNITY): Payer: Self-pay

## 2023-10-25 MED ORDER — BUPRENORPHINE HCL 8 MG SL SUBL
8.0000 mg | SUBLINGUAL_TABLET | Freq: Two times a day (BID) | SUBLINGUAL | 0 refills | Status: AC
  Filled 2023-10-25: qty 60, 30d supply, fill #0

## 2023-10-26 ENCOUNTER — Other Ambulatory Visit (HOSPITAL_COMMUNITY): Payer: Self-pay

## 2023-10-26 MED ORDER — DIAZEPAM 5 MG PO TABS
5.0000 mg | ORAL_TABLET | Freq: Two times a day (BID) | ORAL | 0 refills | Status: AC
  Filled 2023-10-26: qty 60, 30d supply, fill #0

## 2023-10-26 MED ORDER — LAMOTRIGINE 150 MG PO TABS
150.0000 mg | ORAL_TABLET | Freq: Every day | ORAL | 0 refills | Status: DC
  Filled 2023-10-26: qty 30, 30d supply, fill #0

## 2023-11-09 ENCOUNTER — Other Ambulatory Visit (HOSPITAL_COMMUNITY): Payer: Self-pay

## 2023-11-09 MED ORDER — ARIPIPRAZOLE 10 MG PO TABS
15.0000 mg | ORAL_TABLET | ORAL | 0 refills | Status: AC
Start: 2023-11-09 — End: ?
  Filled 2023-11-09: qty 45, 30d supply, fill #0
  Filled 2023-12-20: qty 30, 30d supply, fill #0

## 2023-11-09 MED ORDER — BUPRENORPHINE HCL 8 MG SL SUBL: SUBLINGUAL_TABLET | SUBLINGUAL | 0 refills | Status: AC

## 2023-11-09 MED ORDER — GABAPENTIN 300 MG PO CAPS
300.0000 mg | ORAL_CAPSULE | Freq: Three times a day (TID) | ORAL | 0 refills | Status: AC | PRN
  Filled 2023-11-20: qty 90, 30d supply, fill #0

## 2023-11-09 MED ORDER — LAMOTRIGINE 150 MG PO TABS
150.0000 mg | ORAL_TABLET | Freq: Every day | ORAL | 0 refills | Status: DC
Start: 2023-11-09 — End: 2023-12-21
  Filled 2023-11-09 – 2023-12-20 (×2): qty 30, 30d supply, fill #0

## 2023-11-09 MED ORDER — ARIPIPRAZOLE 10 MG PO TABS
15.0000 mg | ORAL_TABLET | Freq: Every day | ORAL | 0 refills | Status: AC
  Filled 2023-11-09 – 2023-11-22 (×2): qty 45, 30d supply, fill #0

## 2023-11-09 MED ORDER — DIAZEPAM 5 MG PO TABS
5.0000 mg | ORAL_TABLET | Freq: Two times a day (BID) | ORAL | 0 refills | Status: AC | PRN
  Filled 2023-11-20 – 2023-11-23 (×3): qty 60, 30d supply, fill #0

## 2023-11-20 ENCOUNTER — Other Ambulatory Visit (HOSPITAL_COMMUNITY): Payer: Self-pay

## 2023-11-22 ENCOUNTER — Other Ambulatory Visit (HOSPITAL_COMMUNITY): Payer: Self-pay

## 2023-11-22 MED ORDER — BUPRENORPHINE HCL 8 MG SL SUBL
8.0000 mg | SUBLINGUAL_TABLET | Freq: Two times a day (BID) | SUBLINGUAL | 0 refills | Status: AC
  Filled 2023-11-22: qty 60, 30d supply, fill #0

## 2023-11-23 ENCOUNTER — Other Ambulatory Visit: Payer: Self-pay

## 2023-11-23 ENCOUNTER — Other Ambulatory Visit (HOSPITAL_COMMUNITY): Payer: Self-pay

## 2023-12-07 ENCOUNTER — Other Ambulatory Visit (HOSPITAL_COMMUNITY): Payer: Self-pay

## 2023-12-07 MED ORDER — DIAZEPAM 10 MG PO TABS
10.0000 mg | ORAL_TABLET | Freq: Two times a day (BID) | ORAL | 0 refills | Status: DC
  Filled 2023-12-07 – 2023-12-20 (×2): qty 60, 30d supply, fill #0

## 2023-12-19 ENCOUNTER — Other Ambulatory Visit (HOSPITAL_COMMUNITY): Payer: Self-pay

## 2023-12-20 ENCOUNTER — Other Ambulatory Visit (HOSPITAL_COMMUNITY): Payer: Self-pay

## 2023-12-20 MED ORDER — BUPRENORPHINE HCL 8 MG SL SUBL
8.0000 mg | SUBLINGUAL_TABLET | Freq: Two times a day (BID) | SUBLINGUAL | 0 refills | Status: AC
Start: 2023-12-20 — End: ?
  Filled 2023-12-20: qty 60, 30d supply, fill #0

## 2023-12-21 ENCOUNTER — Other Ambulatory Visit (HOSPITAL_COMMUNITY): Payer: Self-pay

## 2023-12-21 MED ORDER — LAMOTRIGINE 150 MG PO TABS
150.0000 mg | ORAL_TABLET | Freq: Every day | ORAL | 0 refills | Status: AC
  Filled 2023-12-21: qty 30, 30d supply, fill #0

## 2023-12-21 MED ORDER — GABAPENTIN 300 MG PO CAPS
300.0000 mg | ORAL_CAPSULE | Freq: Three times a day (TID) | ORAL | 0 refills | Status: AC | PRN
  Filled 2023-12-21 – 2024-01-17 (×2): qty 90, 30d supply, fill #0

## 2023-12-21 MED ORDER — ARIPIPRAZOLE 10 MG PO TABS: 15.0000 mg | ORAL_TABLET | Freq: Every day | ORAL | 0 refills | Status: AC

## 2024-01-17 ENCOUNTER — Other Ambulatory Visit (HOSPITAL_COMMUNITY): Payer: Self-pay

## 2024-01-17 MED ORDER — DIAZEPAM 10 MG PO TABS
10.0000 mg | ORAL_TABLET | Freq: Two times a day (BID) | ORAL | 0 refills | Status: AC
Start: 2024-01-17 — End: ?
  Filled 2024-01-17: qty 60, 30d supply, fill #0

## 2024-01-17 MED ORDER — BUPRENORPHINE HCL 8 MG SL SUBL
8.0000 mg | SUBLINGUAL_TABLET | Freq: Two times a day (BID) | SUBLINGUAL | 0 refills | Status: AC
  Filled 2024-01-17: qty 60, 30d supply, fill #0

## 2024-01-18 ENCOUNTER — Other Ambulatory Visit (HOSPITAL_COMMUNITY): Payer: Self-pay

## 2024-01-18 MED ORDER — LAMOTRIGINE 150 MG PO TABS
150.0000 mg | ORAL_TABLET | Freq: Every day | ORAL | 0 refills | Status: AC
Start: 2024-01-18 — End: ?
  Filled 2024-01-18 – 2024-02-14 (×2): qty 30, 30d supply, fill #0

## 2024-01-18 MED ORDER — ARIPIPRAZOLE 10 MG PO TABS
15.0000 mg | ORAL_TABLET | Freq: Every day | ORAL | 0 refills | Status: AC
  Filled 2024-01-18 – 2024-02-14 (×2): qty 45, 30d supply, fill #0

## 2024-01-18 MED ORDER — GABAPENTIN 300 MG PO CAPS
300.0000 mg | ORAL_CAPSULE | Freq: Three times a day (TID) | ORAL | 0 refills | Status: AC
Start: 2024-01-18 — End: ?
  Filled 2024-01-18 – 2024-02-14 (×2): qty 90, 30d supply, fill #0

## 2024-01-29 ENCOUNTER — Other Ambulatory Visit (HOSPITAL_COMMUNITY): Payer: Self-pay

## 2024-01-31 ENCOUNTER — Emergency Department (HOSPITAL_COMMUNITY)

## 2024-01-31 ENCOUNTER — Emergency Department (HOSPITAL_COMMUNITY)
Admission: EM | Admit: 2024-01-31 | Discharge: 2024-02-01 | Disposition: A | Discharge status: Home / Self Care | Attending: Emergency Medicine | Admitting: Emergency Medicine

## 2024-01-31 ENCOUNTER — Encounter (HOSPITAL_COMMUNITY): Payer: Self-pay

## 2024-01-31 ENCOUNTER — Other Ambulatory Visit: Payer: Self-pay

## 2024-01-31 DIAGNOSIS — J45909 Unspecified asthma, uncomplicated: Secondary | ICD-10-CM | POA: Insufficient documentation

## 2024-01-31 DIAGNOSIS — R031 Nonspecific low blood-pressure reading: Secondary | ICD-10-CM | POA: Insufficient documentation

## 2024-01-31 DIAGNOSIS — Z91040 Latex allergy status: Secondary | ICD-10-CM | POA: Insufficient documentation

## 2024-01-31 DIAGNOSIS — Z7982 Long term (current) use of aspirin: Secondary | ICD-10-CM | POA: Diagnosis not present

## 2024-01-31 DIAGNOSIS — L03211 Cellulitis of face: Secondary | ICD-10-CM | POA: Insufficient documentation

## 2024-01-31 DIAGNOSIS — R600 Localized edema: Secondary | ICD-10-CM | POA: Diagnosis present

## 2024-01-31 LAB — CBC WITH DIFFERENTIAL/PLATELET
Abs Immature Granulocytes: 0.02 10*3/uL (ref 0.00–0.07)
Basophils Absolute: 0 10*3/uL (ref 0.0–0.1)
Basophils Relative: 0 %
Eosinophils Absolute: 0.2 10*3/uL (ref 0.0–0.5)
Eosinophils Relative: 2 %
HCT: 37.4 % (ref 36.0–46.0)
Hemoglobin: 12.5 g/dL (ref 12.0–15.0)
Immature Granulocytes: 0 %
Lymphocytes Relative: 13 %
Lymphs Abs: 1.3 10*3/uL (ref 0.7–4.0)
MCH: 30.4 pg (ref 26.0–34.0)
MCHC: 33.4 g/dL (ref 30.0–36.0)
MCV: 91 fL (ref 80.0–100.0)
Monocytes Absolute: 0.7 10*3/uL (ref 0.1–1.0)
Monocytes Relative: 7 %
Neutro Abs: 7.8 10*3/uL — ABNORMAL HIGH (ref 1.7–7.7)
Neutrophils Relative %: 78 %
Platelets: 256 10*3/uL (ref 150–400)
RBC: 4.11 MIL/uL (ref 3.87–5.11)
RDW: 14.6 % (ref 11.5–15.5)
WBC: 10.1 10*3/uL (ref 4.0–10.5)
nRBC: 0 % (ref 0.0–0.2)

## 2024-01-31 LAB — I-STAT CHEM 8, ED
BUN: 8 mg/dL (ref 6–20)
Calcium, Ion: 0.99 mmol/L — ABNORMAL LOW (ref 1.15–1.40)
Chloride: 101 mmol/L (ref 98–111)
Creatinine, Ser: 0.8 mg/dL (ref 0.44–1.00)
Glucose, Bld: 84 mg/dL (ref 70–99)
HCT: 40 % (ref 36.0–46.0)
Hemoglobin: 13.6 g/dL (ref 12.0–15.0)
Potassium: 6 mmol/L — ABNORMAL HIGH (ref 3.5–5.1)
Sodium: 135 mmol/L (ref 135–145)
TCO2: 27 mmol/L (ref 22–32)

## 2024-01-31 MED ORDER — IOHEXOL 300 MG/ML  SOLN
75.0000 mL | Freq: Once | INTRAMUSCULAR | Status: AC | PRN
  Administered 2024-01-31: 75 mL via INTRAVENOUS

## 2024-01-31 NOTE — ED Triage Notes (Signed)
 Eye pain around 5am this morning. Went to UC given antibiotics since then eye has swollen.   Denies injury/trauma. Left side of face is swollen and red.

## 2024-01-31 NOTE — ED Notes (Signed)
 Patient transported to CT

## 2024-01-31 NOTE — ED Provider Notes (Signed)
 [place] EM[facility] DEPARTMENT AT [facility] Provider Note   CSN: [id number] Arrival date & time: 01/31/24  2200     History {Add pertinent medical, surgical, social history, OB history to HPI:1} Chief Complaint  Patient presents with   Facial Swelling    Hayley Peterson is a 38 y.o. female.  HPI     This is a 38 year old female who presents with facial swelling.  She developed facial swelling and pain earlier this morning.  She was seen and evaluated urgent care.  Per report and review of records, she received a dose of Rocephin and was started on Bactrim for orbital cellulitis.  Patient states swelling has worsened.  She has some pain just inferior to her eye.  She denies any dental pain.  She is never anything like this before.  No vision changes.  Denies fevers.  Home Medications Prior to Admission medications   Medication Sig Start Date End Date Taking? Authorizing Provider  albuterol  (PROVENTIL  HFA;VENTOLIN  HFA) 108 (90 BASE) MCG/ACT inhaler Inhale into the lungs every 6 (six) hours as needed for wheezing or shortness of breath. 2 puffs as needed    [provider]  albuterol  (VENTOLIN  HFA) 108 (90 Base) MCG/ACT inhaler Inhale 2 puffs by inhalation every 4 hours Take as needed 01/24/23     ARIPiprazole  (ABILIFY ) 10 MG tablet Take 1 tablet (10 mg total) by mouth daily. 04/05/23     ARIPiprazole  (ABILIFY ) 10 MG tablet Take 1 tablet (10 mg total) by mouth daily. 05/10/23     ARIPiprazole  (ABILIFY ) 10 MG tablet Take 1 tablet (10 mg total) by mouth daily. 06/07/23     ARIPiprazole  (ABILIFY ) 10 MG tablet Take 1 tablet orally daily 07/05/23     ARIPiprazole  (ABILIFY ) 10 MG tablet Take 1.5 tablets by mouth once a day for 14 days then 1 tablet by mouth once daily. 08/30/23     ARIPiprazole  (ABILIFY ) 10 MG tablet Take 1.5 tablets (15mg ) by mouth for 14 days. Then decrease to 1 tablet (10mg ) by mouth daily. 10/11/23     ARIPiprazole  (ABILIFY ) 10 MG tablet Take 1.5 tablets  (15mg ) daily for 14 days then take one tablet (10 mg) daily 11/09/23     ARIPiprazole  (ABILIFY ) 10 MG tablet Take 1.5 tablets (15 mg total) by mouth daily. 11/09/23     ARIPiprazole  (ABILIFY ) 10 MG tablet Take 1.5 tablets (15 mg total) by mouth daily. 12/21/23     ARIPiprazole  (ABILIFY ) 10 MG tablet Take 1.5 tablets (15 mg total) by mouth daily. 01/18/24     ARIPiprazole  (ABILIFY ) 5 MG tablet Take 1 tablet (5 mg total) by mouth 2 (two) times daily. 01/31/23     Aspirin-Acetaminophen -Caffeine  (GOODY HEADACHE PO) Take 1-2 packets by mouth daily.    [provider]  buprenorphine  (SUBUTEX ) 8 MG SUBL SL tablet Place 1 tablet under the tongue 2 times a day 08/02/23     buprenorphine  (SUBUTEX ) 8 MG SUBL SL tablet Place 1 tablet (8 mg total) under the tongue 2 (two) times daily. 08/23/23     buprenorphine  (SUBUTEX ) 8 MG SUBL SL tablet Place 1 tablet (8 mg total) under the tongue 2 (two) times daily. 09/19/23     buprenorphine  (SUBUTEX ) 8 MG SUBL SL tablet Place 1 tablet (8 mg total) under the tongue 2 (two) times daily. 10/25/23     buprenorphine  (SUBUTEX ) 8 MG SUBL SL tablet Place 1 tablet sublingually 2 times a day 11/09/23     buprenorphine  (SUBUTEX ) 8 MG SUBL  SL tablet Place 1 tablet (8 mg total) under the tongue 2 (two) times daily. 11/22/23     buprenorphine  (SUBUTEX ) 8 MG SUBL SL tablet Place 1 tablet (8 mg total) under the tongue 2 (two) times daily. 12/20/23     buprenorphine  (SUBUTEX ) 8 MG SUBL SL tablet Place 1 tablet (8 mg total) under the tongue 2 (two) times daily. 01/17/24     busPIRone  (BUSPAR ) 15 MG tablet Take 1 tablet by mouth once daily for 2 days, Then 1 tablet Twice daily for 1 day, then 1 tablet by mouth 3 times daily Take with food 10/24/22     clonazePAM  (KLONOPIN ) 0.5 MG tablet Take 1 tablet (0.5 mg total) by mouth daily as needed for severe anxiety (this is a 2-week supply). 09/27/23     diazepam  (VALIUM ) 10 MG tablet Take 1 tablet (10 mg total) by mouth 2 (two) times daily. 01/17/24      diazepam  (VALIUM ) 5 MG tablet Take 1 tablet (5 mg total) by mouth 2 (two) times daily as needed for severe anxiety.  **Do not take with Clonazepam ** 10/11/23     diazepam  (VALIUM ) 5 MG tablet Take 1 tablet (5 mg total) by mouth 2 (two) times daily as needed for severe anxiety 10/26/23     diazepam  (VALIUM ) 5 MG tablet Take 1 tablet (5 mg total) by mouth 2 (two) times daily as needed for severe anxiety 11/09/23     FLUoxetine (PROZAC) 40 MG capsule Take 40 mg by mouth daily. Reported on 10/12/2015    [provider]  gabapentin  (NEURONTIN ) 300 MG capsule Take 1 capsule (300 mg total) by mouth 3 (three) times daily as needed for anxiety. 03/08/23     gabapentin  (NEURONTIN ) 300 MG capsule Take 1 capsule (300 mg total) by mouth 3 (three) times daily as needed for anxiety. 04/05/23     gabapentin  (NEURONTIN ) 300 MG capsule Take 1 capsule (300 mg total) by mouth 3 (three) times daily as needed for anxiety. 05/10/23     gabapentin  (NEURONTIN ) 300 MG capsule Take 1 capsule (300 mg total) by mouth 3 (three) times daily as needed for anxiety 06/07/23     gabapentin  (NEURONTIN ) 300 MG capsule Take 1 capsule (300 mg total) by mouth 3 (three) times daily as needed for anxiety. 08/02/23     gabapentin  (NEURONTIN ) 300 MG capsule Take 1 capsule (300 mg total) by mouth 3 (three) times daily as needed for anxiety. 09/27/23     gabapentin  (NEURONTIN ) 300 MG capsule Take 1 capsule (300 mg total) by mouth 3 (three) times daily for anxiety 10/11/23   Ruta Cousins, NP  gabapentin  (NEURONTIN ) 300 MG capsule Take 1 capsule (300 mg total) by mouth 3 (three) times daily as needed. 11/09/23   Ruta Cousins, NP  gabapentin  (NEURONTIN ) 300 MG capsule Take 1 capsule (300 mg total) by mouth 3 (three) times daily as needed for anxiety 12/21/23     gabapentin  (NEURONTIN ) 300 MG capsule Take 1 capsule (300 mg total) by mouth 3 (three) times daily. 01/18/24     lamoTRIgine  (LAMICTAL ) 100 MG tablet Take 100 mg by mouth daily.     [provider]  lamoTRIgine  (LAMICTAL ) 100 MG tablet Take 1 tablet (100 mg total) by mouth daily (do not restart if medication has not been taken for one week, CALL PROVIDER IF RASH APPEARS) 11/21/22     lamoTRIgine  (LAMICTAL ) 150 MG tablet Take 1 tablet (150 mg total) by mouth at bedtime. 12/21/23  lamoTRIgine  (LAMICTAL ) 150 MG tablet Take 1 tablet (150 mg total) by mouth at bedtime. 01/18/24     lamoTRIgine  (LAMICTAL ) 200 MG tablet Take 1 tablet (200 mg total) by mouth at bedtime. 02/14/23     lamoTRIgine  (LAMICTAL ) 200 MG tablet Take 1 tablet (200 mg total) by mouth at bedtime. 03/14/23     lamoTRIgine  (LAMICTAL ) 200 MG tablet Take 1 tablet (200 mg total) by mouth at bedtime. 04/05/23     lamoTRIgine  (LAMICTAL ) 200 MG tablet Take 1 tablet (200 mg total) by mouth at bedtime. 05/10/23     lamoTRIgine  (LAMICTAL ) 200 MG tablet Take 1 tablet (200 mg total) by mouth at bedtime. 06/07/23     lamoTRIgine  (LAMICTAL ) 200 MG tablet Take 1 tablet (200 mg total) by mouth at bedtime. 07/05/23     lamoTRIgine  (LAMICTAL ) 200 MG tablet Take 1 tablet (200 mg total) by mouth at bedtime. 08/02/23     lamoTRIgine  (LAMICTAL ) 200 MG tablet Take 1 tablet (200 mg total) by mouth at bedtime. 08/30/23     lamoTRIgine  (LAMICTAL ) 200 MG tablet Take 1 tablet (200 mg total) by mouth at bedtime. 10/11/23     lamoTRIgine  (LAMICTAL ) 5 MG CHEW chewable tablet Take 1 tablet by mouth daily x 14 days, 2 tablets daily x 14 days, 3 tablets daily x 14 days, then 4 tablets daily x 14 days (Call provider if rash appears) 10/24/22     lithium  carbonate (LITHOBID ) 300 MG ER tablet Take 1 tablet by mouth at bedtime x 3 days, then 2 tablets at bedtime x 3 days, then 3 tablets at betime 12/19/22     LORazepam  (ATIVAN ) 0.5 MG tablet Take 1 tablet (0.5 mg total) by mouth daily as needed for severe anxiety (this is a 2-week supply) 03/14/23     LORazepam  (ATIVAN ) 0.5 MG tablet Take 1 tablet (0.5 mg total) by mouth daily as needed for severe anxiety  (this is a 2-week supply) 04/12/23     LORazepam  (ATIVAN ) 0.5 MG tablet Take 1 tablet (0.5 mg total) by mouth daily as needed for severe anxiety. 05/10/23     LORazepam  (ATIVAN ) 0.5 MG tablet Take 1 tablet (0.5 mg total) by mouth daily as needed for severe anxiety (this is a 4 week supply) 06/07/23     LORazepam  (ATIVAN ) 0.5 MG tablet Take 1 tablet (0.5 mg total) by mouth daily as needed for severe anxiety 07/05/23     LORazepam  (ATIVAN ) 0.5 MG tablet Take 1 tablet (0.5 mg total) by mouth daily as needed for severe anxiety.  (This is a 30 day supply per MD) 08/02/23     LORazepam  (ATIVAN ) 0.5 MG tablet Take 1 tablet (0.5 mg total) by mouth daily as needed for severe anxiety. 08/30/23     pregabalin  (LYRICA ) 50 MG capsule Take 1 capsule (50 mg total) by mouth 2 (two) times daily. 07/05/23     pregabalin  (LYRICA ) 75 MG capsule Take 1 capsule (75 mg total) by mouth 2 (two) times daily. 08/30/23     QUEtiapine  (SEROQUEL ) 50 MG tablet Take 1 tablet (50 mg total) by mouth at bedtime. 01/02/23         Allergies    Latex    Review of Systems   Review of Systems  Constitutional:  Negative for fever.  HENT:  Positive for facial swelling.   Respiratory:  Negative for shortness of breath.   Cardiovascular:  Negative for chest pain.  All other systems reviewed and are negative.   Physical Exam  Updated Vital Signs BP 116/74 (BP Location: Right Arm)   Pulse 77   Temp 98.7 F (37.1 C) (Oral)   Resp 18   Ht 1.575 m (5\' 2" )   Wt 56.2 kg   SpO2 98%   BMI 22.68 kg/m  Physical Exam Vitals and nursing note reviewed.  Constitutional:      Appearance: She is well-developed. She is not ill-appearing.  HENT:     Head: Normocephalic and atraumatic.     Comments: Diffuse swelling of the left side of the face involving the left orbit, cheek into the upper lip, slight erythema noted, no fluctuance    Mouth/Throat:     Comments: No tenderness to palpation along the gumline or suggestion of dental  infection Eyes:     Extraocular Movements: Extraocular movements intact.     Pupils: Pupils are equal, round, and reactive to light.  Cardiovascular:     Rate and Rhythm: Normal rate and regular rhythm.     Heart sounds: Normal heart sounds.  Pulmonary:     Effort: Pulmonary effort is normal. No respiratory distress.     Breath sounds: No wheezing.  Abdominal:     Palpations: Abdomen is soft.  Musculoskeletal:     Cervical back: Neck supple.  Skin:    General: Skin is warm and dry.  Neurological:     Mental Status: She is alert and oriented to person, place, and time.  Psychiatric:        Mood and Affect: Mood normal.     ED Results / Procedures / Treatments   Labs (all labs ordered are listed, but only abnormal results are displayed) Labs Reviewed  CBC WITH DIFFERENTIAL/PLATELET  I-STAT CHEM 8, ED    EKG None  Radiology No results found.  Procedures Procedures  {Document cardiac monitor, telemetry assessment procedure when appropriate:1}  Medications Ordered in ED Medications - No data to display  ED Course/ Medical Decision Making/ A&P   {   Click here for ABCD2, HEART and other calculatorsREFRESH Note before signing :1}                              Medical Decision Making Amount and/or Complexity of Data Reviewed Labs: ordered. Radiology: ordered.   ***  {Document critical care time when appropriate:1} {Document review of labs and clinical decision tools ie heart score, Chads2Vasc2 etc:1}  {Document your independent review of radiology images, and any outside records:1} {Document your discussion with family members, caretakers, and with consultants:1} {Document social determinants of health affecting pt's care:1} {Document your decision making why or why not admission, treatments were needed:1} Final Clinical Impression(s) / ED Diagnoses Final diagnoses:  None    Rx / DC Orders ED Discharge Orders     None

## 2024-02-01 MED ORDER — DOXYCYCLINE HYCLATE 100 MG PO CAPS: 100.0000 mg | ORAL_CAPSULE | Freq: Two times a day (BID) | ORAL | 0 refills | Status: AC

## 2024-02-01 MED ORDER — VANCOMYCIN HCL IN DEXTROSE 1-5 GM/200ML-% IV SOLN
1000.0000 mg | Freq: Once | INTRAVENOUS | Status: AC
  Administered 2024-02-01: 1000 mg via INTRAVENOUS
  Filled 2024-02-01: qty 200

## 2024-02-01 MED ORDER — KETOROLAC TROMETHAMINE 30 MG/ML IJ SOLN
30.0000 mg | Freq: Once | INTRAMUSCULAR | Status: AC
  Administered 2024-02-01: 30 mg via INTRAVENOUS
  Filled 2024-02-01: qty 1

## 2024-02-01 NOTE — Discharge Instructions (Addendum)
 You were seen today and have evidence of cellulitis of the face.  We will change her antibiotic to doxycycline.  If you develop worsening swelling, fevers, any new or worsening symptoms, you should be reevaluated.  Stop taking the prescribed Bactrim.

## 2024-02-01 NOTE — ED Notes (Signed)
 ED Provider at bedside.

## 2024-02-14 ENCOUNTER — Other Ambulatory Visit (HOSPITAL_COMMUNITY): Payer: Self-pay

## 2024-02-14 MED ORDER — BUPRENORPHINE HCL 8 MG SL SUBL
8.0000 mg | SUBLINGUAL_TABLET | Freq: Two times a day (BID) | SUBLINGUAL | 0 refills | Status: AC
  Filled 2024-02-14: qty 60, 30d supply, fill #0

## 2024-03-01 ENCOUNTER — Other Ambulatory Visit (HOSPITAL_COMMUNITY): Payer: Self-pay

## 2024-03-01 MED ORDER — ARIPIPRAZOLE 10 MG PO TABS
15.0000 mg | ORAL_TABLET | Freq: Every day | ORAL | 0 refills | Status: AC
  Filled 2024-03-01 – 2024-08-28 (×2): qty 45, 30d supply, fill #0

## 2024-03-01 MED ORDER — LAMOTRIGINE 150 MG PO TABS
150.0000 mg | ORAL_TABLET | Freq: Every day | ORAL | 0 refills | Status: AC
  Filled 2024-03-01: qty 30, 30d supply, fill #0

## 2024-03-01 MED ORDER — GABAPENTIN 300 MG PO CAPS
300.0000 mg | ORAL_CAPSULE | Freq: Three times a day (TID) | ORAL | 0 refills | Status: AC | PRN
  Filled 2024-03-01 – 2024-09-25 (×3): qty 90, 30d supply, fill #0

## 2024-03-13 ENCOUNTER — Other Ambulatory Visit (HOSPITAL_COMMUNITY): Payer: Self-pay

## 2024-03-13 MED ORDER — BUPRENORPHINE HCL 8 MG SL SUBL
8.0000 mg | SUBLINGUAL_TABLET | Freq: Two times a day (BID) | SUBLINGUAL | 0 refills | Status: AC
  Filled 2024-03-13: qty 60, 30d supply, fill #0

## 2024-03-14 ENCOUNTER — Other Ambulatory Visit (HOSPITAL_COMMUNITY): Payer: Self-pay

## 2024-04-10 ENCOUNTER — Other Ambulatory Visit (HOSPITAL_COMMUNITY): Payer: Self-pay

## 2024-04-10 MED ORDER — BUPRENORPHINE HCL 8 MG SL SUBL
8.0000 mg | SUBLINGUAL_TABLET | Freq: Two times a day (BID) | SUBLINGUAL | 0 refills | Status: DC
  Filled 2024-04-10: qty 60, 30d supply, fill #0

## 2024-05-03 ENCOUNTER — Other Ambulatory Visit (HOSPITAL_COMMUNITY): Payer: Self-pay

## 2024-05-08 ENCOUNTER — Other Ambulatory Visit: Payer: Self-pay

## 2024-05-08 ENCOUNTER — Other Ambulatory Visit (HOSPITAL_COMMUNITY): Payer: Self-pay

## 2024-05-08 MED ORDER — BUPRENORPHINE HCL 8 MG SL SUBL
8.0000 mg | SUBLINGUAL_TABLET | Freq: Two times a day (BID) | SUBLINGUAL | 0 refills | Status: DC
  Filled 2024-05-08: qty 60, 30d supply, fill #0

## 2024-06-05 ENCOUNTER — Other Ambulatory Visit (HOSPITAL_BASED_OUTPATIENT_CLINIC_OR_DEPARTMENT_OTHER): Payer: Self-pay

## 2024-06-05 MED ORDER — GABAPENTIN 300 MG PO CAPS
300.0000 mg | ORAL_CAPSULE | Freq: Every day | ORAL | 1 refills | Status: AC
  Filled 2024-06-05: qty 30, 30d supply, fill #0
  Filled 2024-10-24: qty 30, 30d supply, fill #1

## 2024-06-05 MED ORDER — BUPRENORPHINE HCL 8 MG SL SUBL
8.0000 mg | SUBLINGUAL_TABLET | Freq: Two times a day (BID) | SUBLINGUAL | 0 refills | Status: DC
  Filled 2024-06-05: qty 60, 30d supply, fill #0

## 2024-06-05 MED ORDER — ARIPIPRAZOLE 10 MG PO TABS
15.0000 mg | ORAL_TABLET | Freq: Every day | ORAL | 0 refills | Status: AC
  Filled 2024-06-05: qty 45, 30d supply, fill #0

## 2024-06-05 MED ORDER — DIAZEPAM 10 MG PO TABS
15.0000 mg | ORAL_TABLET | Freq: Every day | ORAL | 0 refills | Status: DC | PRN
  Filled 2024-06-05: qty 45, 30d supply, fill #0

## 2024-07-03 ENCOUNTER — Other Ambulatory Visit (HOSPITAL_BASED_OUTPATIENT_CLINIC_OR_DEPARTMENT_OTHER): Payer: Self-pay

## 2024-07-03 MED ORDER — ARIPIPRAZOLE 10 MG PO TABS
15.0000 mg | ORAL_TABLET | Freq: Every day | ORAL | 0 refills | Status: AC
  Filled 2024-07-03: qty 45, 30d supply, fill #0

## 2024-07-03 MED ORDER — DIAZEPAM 10 MG PO TABS
15.0000 mg | ORAL_TABLET | Freq: Every day | ORAL | 0 refills | Status: DC | PRN
  Filled 2024-07-03: qty 45, 30d supply, fill #0

## 2024-07-03 MED ORDER — BUPRENORPHINE HCL 8 MG SL SUBL
8.0000 mg | SUBLINGUAL_TABLET | Freq: Two times a day (BID) | SUBLINGUAL | 0 refills | Status: AC
  Filled 2024-07-03: qty 60, 30d supply, fill #0

## 2024-07-03 MED ORDER — GABAPENTIN 300 MG PO CAPS
300.0000 mg | ORAL_CAPSULE | Freq: Every day | ORAL | 0 refills | Status: AC
  Filled 2024-07-03: qty 30, 30d supply, fill #0

## 2024-07-31 ENCOUNTER — Other Ambulatory Visit (HOSPITAL_BASED_OUTPATIENT_CLINIC_OR_DEPARTMENT_OTHER): Payer: Self-pay

## 2024-07-31 MED ORDER — ARIPIPRAZOLE 10 MG PO TABS
15.0000 mg | ORAL_TABLET | Freq: Every day | ORAL | 2 refills | Status: AC
  Filled 2024-07-31: qty 45, 30d supply, fill #0
  Filled 2024-08-28: qty 45, 30d supply, fill #1
  Filled 2024-09-25: qty 45, 30d supply, fill #2

## 2024-07-31 MED ORDER — BUPRENORPHINE HCL 8 MG SL SUBL
SUBLINGUAL_TABLET | SUBLINGUAL | 0 refills | Status: DC
  Filled 2024-07-31: qty 60, 30d supply, fill #0

## 2024-07-31 MED ORDER — DIAZEPAM 10 MG PO TABS
15.0000 mg | ORAL_TABLET | Freq: Every day | ORAL | 0 refills | Status: DC | PRN
  Filled 2024-07-31: qty 45, 30d supply, fill #0

## 2024-07-31 MED ORDER — GABAPENTIN 300 MG PO CAPS
300.0000 mg | ORAL_CAPSULE | Freq: Every day | ORAL | 2 refills | Status: AC
  Filled 2024-07-31: qty 30, 30d supply, fill #0
  Filled 2024-08-28: qty 30, 30d supply, fill #1
  Filled 2024-09-25: qty 30, 30d supply, fill #2

## 2024-08-28 ENCOUNTER — Other Ambulatory Visit (HOSPITAL_BASED_OUTPATIENT_CLINIC_OR_DEPARTMENT_OTHER): Payer: Self-pay

## 2024-08-28 MED ORDER — DIAZEPAM 10 MG PO TABS
15.0000 mg | ORAL_TABLET | Freq: Every day | ORAL | 0 refills | Status: AC | PRN
  Filled 2024-08-28: qty 45, 30d supply, fill #0

## 2024-08-28 MED ORDER — BUPRENORPHINE HCL 8 MG SL SUBL
SUBLINGUAL_TABLET | SUBLINGUAL | 0 refills | Status: DC
  Filled 2024-08-28: qty 60, 30d supply, fill #0

## 2024-08-29 ENCOUNTER — Other Ambulatory Visit (HOSPITAL_BASED_OUTPATIENT_CLINIC_OR_DEPARTMENT_OTHER): Payer: Self-pay

## 2024-09-25 ENCOUNTER — Other Ambulatory Visit (HOSPITAL_BASED_OUTPATIENT_CLINIC_OR_DEPARTMENT_OTHER): Payer: Self-pay

## 2024-09-25 MED ORDER — BUPRENORPHINE HCL 8 MG SL SUBL
8.0000 mg | SUBLINGUAL_TABLET | Freq: Two times a day (BID) | SUBLINGUAL | 0 refills | Status: DC
  Filled 2024-09-25: qty 15, 8d supply, fill #0
  Filled 2024-09-26: qty 45, 22d supply, fill #0

## 2024-09-25 MED ORDER — DIAZEPAM 10 MG PO TABS
15.0000 mg | ORAL_TABLET | Freq: Every day | ORAL | 0 refills | Status: AC
  Filled 2024-09-25: qty 45, 30d supply, fill #0

## 2024-09-26 ENCOUNTER — Other Ambulatory Visit (HOSPITAL_BASED_OUTPATIENT_CLINIC_OR_DEPARTMENT_OTHER): Payer: Self-pay

## 2024-09-27 ENCOUNTER — Other Ambulatory Visit (HOSPITAL_BASED_OUTPATIENT_CLINIC_OR_DEPARTMENT_OTHER): Payer: Self-pay

## 2024-10-07 ENCOUNTER — Other Ambulatory Visit: Payer: Self-pay

## 2024-10-23 ENCOUNTER — Other Ambulatory Visit (HOSPITAL_BASED_OUTPATIENT_CLINIC_OR_DEPARTMENT_OTHER): Payer: Self-pay

## 2024-10-23 MED ORDER — GABAPENTIN 300 MG PO CAPS: 300.0000 mg | ORAL_CAPSULE | Freq: Every day | ORAL | 0 refills | Status: AC

## 2024-10-23 MED ORDER — BUPRENORPHINE HCL 8 MG SL SUBL: 8.0000 mg | SUBLINGUAL_TABLET | Freq: Two times a day (BID) | SUBLINGUAL | 0 refills | Status: AC

## 2024-10-23 MED ORDER — BUPRENORPHINE HCL 8 MG SL SUBL
8.0000 mg | SUBLINGUAL_TABLET | Freq: Two times a day (BID) | SUBLINGUAL | 0 refills | Status: AC
  Filled 2024-10-24: qty 60, 30d supply, fill #0

## 2024-10-23 MED ORDER — ARIPIPRAZOLE 10 MG PO TABS
15.0000 mg | ORAL_TABLET | Freq: Every day | ORAL | 0 refills | Status: AC
  Filled 2024-10-23: qty 45, 30d supply, fill #0

## 2024-10-23 MED ORDER — DIAZEPAM 10 MG PO TABS: 15.0000 mg | ORAL_TABLET | Freq: Every day | ORAL | 0 refills | Status: AC | PRN

## 2024-10-23 MED ORDER — GABAPENTIN 300 MG PO CAPS
300.0000 mg | ORAL_CAPSULE | Freq: Every day | ORAL | 0 refills | Status: AC
  Filled 2024-10-23: qty 30, 30d supply, fill #0

## 2024-10-23 MED ORDER — DIAZEPAM 10 MG PO TABS
15.0000 mg | ORAL_TABLET | Freq: Every day | ORAL | 0 refills | Status: AC | PRN
  Filled 2024-10-23 – 2024-10-24 (×2): qty 45, 30d supply, fill #0

## 2024-10-23 MED ORDER — ARIPIPRAZOLE 10 MG PO TABS: 15.0000 mg | ORAL_TABLET | Freq: Every day | ORAL | 0 refills | Status: AC

## 2024-10-24 ENCOUNTER — Other Ambulatory Visit (HOSPITAL_BASED_OUTPATIENT_CLINIC_OR_DEPARTMENT_OTHER): Payer: Self-pay

## 2024-10-25 ENCOUNTER — Other Ambulatory Visit (HOSPITAL_BASED_OUTPATIENT_CLINIC_OR_DEPARTMENT_OTHER): Payer: Self-pay
